# Patient Record
Sex: Male | Born: 1960 | Race: White | Hispanic: No | Marital: Single | Smoking: Former smoker
Health system: Southern US, Community
[De-identification: ages and names within clinical notes are randomized; demographics above are authoritative.]

## PROBLEM LIST (undated history)

## (undated) DIAGNOSIS — K819 Cholecystitis, unspecified: Secondary | ICD-10-CM

## (undated) DIAGNOSIS — I10 Essential (primary) hypertension: Secondary | ICD-10-CM

## (undated) DIAGNOSIS — E785 Hyperlipidemia, unspecified: Secondary | ICD-10-CM

## (undated) DIAGNOSIS — K219 Gastro-esophageal reflux disease without esophagitis: Secondary | ICD-10-CM

## (undated) DIAGNOSIS — M1990 Unspecified osteoarthritis, unspecified site: Secondary | ICD-10-CM

## (undated) HISTORY — PX: COSMETIC SURGERY: SHX468

## (undated) HISTORY — DX: Hyperlipidemia, unspecified: E78.5

## (undated) HISTORY — DX: Cholecystitis, unspecified: K81.9

---

## 2002-06-21 ENCOUNTER — Encounter: Payer: Self-pay | Admitting: Family Medicine

## 2002-06-21 ENCOUNTER — Encounter: Admission: RE | Admit: 2002-06-21 | Discharge: 2002-06-21 | Payer: Self-pay | Admitting: Family Medicine

## 2007-09-24 ENCOUNTER — Encounter: Admission: RE | Admit: 2007-09-24 | Discharge: 2007-09-24 | Payer: Self-pay | Admitting: Family Medicine

## 2007-10-05 ENCOUNTER — Encounter: Admission: RE | Admit: 2007-10-05 | Discharge: 2007-10-05 | Payer: Self-pay | Admitting: Family Medicine

## 2013-12-08 ENCOUNTER — Other Ambulatory Visit: Payer: Self-pay | Admitting: Orthopedic Surgery

## 2013-12-08 DIAGNOSIS — R0781 Pleurodynia: Secondary | ICD-10-CM

## 2013-12-09 ENCOUNTER — Other Ambulatory Visit: Payer: Self-pay

## 2013-12-13 ENCOUNTER — Other Ambulatory Visit: Payer: Self-pay

## 2014-03-22 ENCOUNTER — Ambulatory Visit (INDEPENDENT_AMBULATORY_CARE_PROVIDER_SITE_OTHER): Payer: BC Managed Care – PPO | Admitting: General Surgery

## 2014-04-03 NOTE — Pre-Procedure Instructions (Signed)
Logan Kim  04/03/2014   Your procedure is scheduled on:  Wednesday September 16 th at 1006 AM  Report to [facility] Admitting at [phone] AM.  Call this number if you have problems the morning of surgery: [phone]   Remember:   Do not eat food or drink liquids after midnight.   Take these medicines the morning of surgery with A SIP OF WATER: Hydrocodone if needed for pain Stop Ibuprofen and multivitamins now.  Do not wear jewelry.  Do not wear lotions, powders, or colognes. You may not wear deodorant.   Men may shave face and neck.  Do not bring valuables to the hospital.  [facility] is not responsible for any belongings or valuables.               Contacts, dentures or bridgework may not be worn into surgery.  Leave suitcase in the car. After surgery it may be brought to your room.  For patients admitted to the hospital, discharge time is determined by your treatment team.               Patients discharged the day of surgery will not be allowed to drive home.    Special Instructions: [facility] - Preparing for Surgery  Before surgery, you can play an important role.  Because skin is not sterile, your skin needs to be as free of germs as possible.  You can reduce the number of germs on you skin by washing with CHG (chlorahexidine gluconate) soap before surgery.  CHG is an antiseptic cleaner which kills germs and bonds with the skin to continue killing germs even after washing.  Please DO NOT use if you have an allergy to CHG or antibacterial soaps.  If your skin becomes reddened/irritated stop using the CHG and inform your nurse when you arrive at Short Stay.  Do not shave (including legs and underarms) for at least 48 hours prior to the first CHG shower.  You may shave your face.  Please follow these instructions carefully:   1.  Shower with CHG Soap the night before surgery and the                                morning of Surgery.  2.  If you choose to wash your  hair, wash your hair first as usual with your       normal shampoo.  3.  After you shampoo, rinse your hair and body thoroughly to remove the                      Shampoo.  4.  Use CHG as you would any other liquid soap.  You can apply chg directly       to the skin and wash gently with scrungie or a clean washcloth.  5.  Apply the CHG Soap to your body ONLY FROM THE NECK DOWN.        Do not use on open wounds or open sores.  Avoid contact with your eyes,       ears, mouth and genitals (private parts).  Wash genitals (private parts)       with your normal soap.  6.  Wash thoroughly, paying special attention to the area where your surgery        will be performed.  7.  Thoroughly rinse your body with warm water from the  neck down.  8.  DO NOT shower/wash with your normal soap after using and rinsing off       the CHG Soap.  9.  Pat yourself dry with a clean towel.            10.  Wear clean pajamas.            11.  Place clean sheets on your bed the night of your first shower and do not        sleep with pets.  Day of Surgery  Do not apply any lotions/deoderants the morning of surgery.  Please wear clean clothes to the hospital/surgery center.      Please read over the following fact sheets that you were given: Pain Booklet, Coughing and Deep Breathing and Surgical Site Infection Prevention

## 2014-04-04 ENCOUNTER — Encounter (HOSPITAL_COMMUNITY): Payer: Self-pay

## 2014-04-04 ENCOUNTER — Encounter (HOSPITAL_COMMUNITY)
Admission: RE | Admit: 2014-04-04 | Discharge: 2014-04-04 | Disposition: A | Discharge status: Home / Self Care | Payer: BC Managed Care – PPO | Source: Ambulatory Visit | Attending: Anesthesiology | Admitting: Anesthesiology

## 2014-04-04 ENCOUNTER — Encounter (HOSPITAL_COMMUNITY)
Admission: RE | Admit: 2014-04-04 | Discharge: 2014-04-04 | Disposition: A | Discharge status: Home / Self Care | Payer: BC Managed Care – PPO | Source: Ambulatory Visit | Attending: General Surgery | Admitting: General Surgery

## 2014-04-04 DIAGNOSIS — K8010 Calculus of gallbladder with chronic cholecystitis without obstruction: Secondary | ICD-10-CM | POA: Diagnosis not present

## 2014-04-04 DIAGNOSIS — Z880 Allergy status to penicillin: Secondary | ICD-10-CM | POA: Diagnosis not present

## 2014-04-04 DIAGNOSIS — K8020 Calculus of gallbladder without cholecystitis without obstruction: Secondary | ICD-10-CM | POA: Diagnosis present

## 2014-04-04 HISTORY — DX: Essential (primary) hypertension: I10

## 2014-04-04 HISTORY — DX: Gastro-esophageal reflux disease without esophagitis: K21.9

## 2014-04-04 HISTORY — DX: Unspecified osteoarthritis, unspecified site: M19.90

## 2014-04-04 LAB — CBC
HCT: 49.8 % (ref 39.0–52.0)
Hemoglobin: 17.3 g/dL — ABNORMAL HIGH (ref 13.0–17.0)
MCH: 34.2 pg — AB (ref 26.0–34.0)
MCHC: 34.7 g/dL (ref 30.0–36.0)
MCV: 98.4 fL (ref 78.0–100.0)
Platelets: 235 10*3/uL (ref 150–400)
RBC: 5.06 MIL/uL (ref 4.22–5.81)
RDW: 12.1 % (ref 11.5–15.5)
WBC: 4.4 10*3/uL (ref 4.0–10.5)

## 2014-04-04 MED ORDER — CHLORHEXIDINE GLUCONATE 4 % EX LIQD: 1.0000 "application " | Freq: Once | CUTANEOUS | Status: DC

## 2014-04-04 NOTE — Progress Notes (Signed)
BMP specimen hemolyzed per lab , will need to be redone DOS

## 2014-04-05 ENCOUNTER — Encounter (HOSPITAL_COMMUNITY): Payer: Self-pay | Admitting: *Deleted

## 2014-04-05 ENCOUNTER — Encounter (HOSPITAL_COMMUNITY): Payer: BC Managed Care – PPO | Admitting: Certified Registered Nurse Anesthetist

## 2014-04-05 ENCOUNTER — Encounter (HOSPITAL_COMMUNITY)
Admission: RE | Disposition: A | Discharge status: Home / Self Care | Payer: Self-pay | Source: Ambulatory Visit | Attending: General Surgery

## 2014-04-05 ENCOUNTER — Ambulatory Visit (HOSPITAL_COMMUNITY): Payer: BC Managed Care – PPO | Admitting: Certified Registered Nurse Anesthetist

## 2014-04-05 ENCOUNTER — Ambulatory Visit (HOSPITAL_COMMUNITY)
Admission: RE | Admit: 2014-04-05 | Discharge: 2014-04-05 | Disposition: A | Discharge status: Home / Self Care | Payer: BC Managed Care – PPO | Source: Ambulatory Visit | Attending: General Surgery | Admitting: General Surgery

## 2014-04-05 DIAGNOSIS — K8010 Calculus of gallbladder with chronic cholecystitis without obstruction: Secondary | ICD-10-CM | POA: Diagnosis not present

## 2014-04-05 DIAGNOSIS — Z880 Allergy status to penicillin: Secondary | ICD-10-CM | POA: Insufficient documentation

## 2014-04-05 DIAGNOSIS — K8020 Calculus of gallbladder without cholecystitis without obstruction: Secondary | ICD-10-CM

## 2014-04-05 HISTORY — PX: CHOLECYSTECTOMY: SHX55

## 2014-04-05 LAB — BASIC METABOLIC PANEL
ANION GAP: 11 (ref 5–15)
BUN: 12 mg/dL (ref 6–23)
CALCIUM: 9.1 mg/dL (ref 8.4–10.5)
CO2: 28 meq/L (ref 19–32)
CREATININE: 0.85 mg/dL (ref 0.50–1.35)
Chloride: 98 mEq/L (ref 96–112)
GFR calc Af Amer: >90 mL/min (ref >90)
Glucose, Bld: 111 mg/dL — ABNORMAL HIGH (ref 70–99)
Potassium: 4 mEq/L (ref 3.7–5.3)
SODIUM: 137 meq/L (ref 137–147)

## 2014-04-05 SURGERY — LAPAROSCOPIC CHOLECYSTECTOMY WITH INTRAOPERATIVE CHOLANGIOGRAM
Anesthesia: General | Site: Abdomen

## 2014-04-05 MED ORDER — MIDAZOLAM HCL 2 MG/2ML IJ SOLN
INTRAMUSCULAR | Status: AC
  Filled 2014-04-05: qty 2

## 2014-04-05 MED ORDER — HYDROMORPHONE HCL 1 MG/ML IJ SOLN
0.5000 mg | INTRAMUSCULAR | Status: DC | PRN
  Administered 2014-04-05: 0.5 mg via INTRAVENOUS

## 2014-04-05 MED ORDER — PROPOFOL 10 MG/ML IV BOLUS
INTRAVENOUS | Status: AC
  Filled 2014-04-05: qty 20

## 2014-04-05 MED ORDER — FENTANYL CITRATE 0.05 MG/ML IJ SOLN
INTRAMUSCULAR | Status: AC
  Filled 2014-04-05: qty 5

## 2014-04-05 MED ORDER — VANCOMYCIN HCL 10 G IV SOLR
1500.0000 mg | INTRAVENOUS | Status: AC
  Administered 2014-04-05: 1500 mg via INTRAVENOUS
  Filled 2014-04-05 (×2): qty 1500

## 2014-04-05 MED ORDER — BUPIVACAINE-EPINEPHRINE (PF) 0.25% -1:200000 IJ SOLN
INTRAMUSCULAR | Status: AC
Start: 2014-04-05 — End: 2014-04-05
  Filled 2014-04-05: qty 30

## 2014-04-05 MED ORDER — ONDANSETRON HCL 4 MG/2ML IJ SOLN: 4.0000 mg | Freq: Once | INTRAMUSCULAR | Status: DC | PRN

## 2014-04-05 MED ORDER — NEOSTIGMINE METHYLSULFATE 10 MG/10ML IV SOLN
INTRAVENOUS | Status: DC | PRN
  Administered 2014-04-05: 5 mg via INTRAVENOUS

## 2014-04-05 MED ORDER — OXYCODONE HCL 5 MG PO TABS
5.0000 mg | ORAL_TABLET | Freq: Once | ORAL | Status: AC
  Administered 2014-04-05: 5 mg via ORAL

## 2014-04-05 MED ORDER — FENTANYL CITRATE 0.05 MG/ML IJ SOLN
INTRAMUSCULAR | Status: DC | PRN
  Administered 2014-04-05 (×2): 100 ug via INTRAVENOUS
  Administered 2014-04-05 (×2): 50 ug via INTRAVENOUS
  Administered 2014-04-05 (×2): 100 ug via INTRAVENOUS

## 2014-04-05 MED ORDER — SODIUM CHLORIDE 0.9 % IR SOLN
Status: DC | PRN
  Administered 2014-04-05: 1000 mL

## 2014-04-05 MED ORDER — HYDROMORPHONE HCL 1 MG/ML IJ SOLN
INTRAMUSCULAR | Status: AC
  Administered 2014-04-05: 0.5 mg via INTRAVENOUS
  Filled 2014-04-05: qty 1

## 2014-04-05 MED ORDER — ROCURONIUM BROMIDE 100 MG/10ML IV SOLN
INTRAVENOUS | Status: DC | PRN
  Administered 2014-04-05: 50 mg via INTRAVENOUS

## 2014-04-05 MED ORDER — CEFAZOLIN SODIUM-DEXTROSE 2-3 GM-% IV SOLR
INTRAVENOUS | Status: AC
  Filled 2014-04-05: qty 50

## 2014-04-05 MED ORDER — HYDROMORPHONE HCL PF 1 MG/ML IJ SOLN
INTRAMUSCULAR | Status: DC
Start: 2014-04-05 — End: 2014-04-05
  Filled 2014-04-05: qty 1

## 2014-04-05 MED ORDER — BUPIVACAINE-EPINEPHRINE 0.25% -1:200000 IJ SOLN
INTRAMUSCULAR | Status: DC | PRN
  Administered 2014-04-05: 30 mL

## 2014-04-05 MED ORDER — PROPOFOL 10 MG/ML IV BOLUS
INTRAVENOUS | Status: DC | PRN
  Administered 2014-04-05: 200 mg via INTRAVENOUS

## 2014-04-05 MED ORDER — CEFAZOLIN SODIUM-DEXTROSE 2-3 GM-% IV SOLR: 2.0000 g | INTRAVENOUS | Status: DC

## 2014-04-05 MED ORDER — 0.9 % SODIUM CHLORIDE (POUR BTL) OPTIME
TOPICAL | Status: DC | PRN
  Administered 2014-04-05: 1000 mL

## 2014-04-05 MED ORDER — HYDROMORPHONE HCL 1 MG/ML IJ SOLN
INTRAMUSCULAR | Status: AC
  Filled 2014-04-05: qty 1

## 2014-04-05 MED ORDER — SODIUM CHLORIDE 0.9 % IV SOLN: INTRAVENOUS | Status: DC | PRN

## 2014-04-05 MED ORDER — GLYCOPYRROLATE 0.2 MG/ML IJ SOLN
INTRAMUSCULAR | Status: DC | PRN
  Administered 2014-04-05: 0.6 mg via INTRAVENOUS

## 2014-04-05 MED ORDER — LIDOCAINE HCL (CARDIAC) 20 MG/ML IV SOLN
INTRAVENOUS | Status: DC | PRN
  Administered 2014-04-05: 100 mg via INTRAVENOUS

## 2014-04-05 MED ORDER — ONDANSETRON HCL 4 MG/2ML IJ SOLN
INTRAMUSCULAR | Status: DC | PRN
  Administered 2014-04-05: 4 mg via INTRAVENOUS

## 2014-04-05 MED ORDER — DEXAMETHASONE SODIUM PHOSPHATE 4 MG/ML IJ SOLN
INTRAMUSCULAR | Status: DC | PRN
  Administered 2014-04-05: 4 mg via INTRAVENOUS

## 2014-04-05 MED ORDER — LACTATED RINGERS IV SOLN
INTRAVENOUS | Status: DC
  Administered 2014-04-05 (×2): via INTRAVENOUS

## 2014-04-05 MED ORDER — HYDROMORPHONE HCL PF 1 MG/ML IJ SOLN
0.2500 mg | INTRAMUSCULAR | Status: DC | PRN
  Administered 2014-04-05 (×4): 0.5 mg via INTRAVENOUS

## 2014-04-05 MED ORDER — OXYCODONE HCL 5 MG PO TABS
ORAL_TABLET | ORAL | Status: AC
  Filled 2014-04-05: qty 1

## 2014-04-05 MED ORDER — OXYCODONE HCL 5 MG PO TABS: 5.0000 mg | ORAL_TABLET | Freq: Four times a day (QID) | ORAL | Status: DC | PRN

## 2014-04-05 MED ORDER — EPHEDRINE SULFATE 50 MG/ML IJ SOLN
INTRAMUSCULAR | Status: DC | PRN
  Administered 2014-04-05: 10 mg via INTRAVENOUS

## 2014-04-05 SURGICAL SUPPLY — 42 items
APPLIER CLIP 5 13 M/L LIGAMAX5 (MISCELLANEOUS) ×2
BLADE SURG ROTATE 9660 (MISCELLANEOUS) ×2 IMPLANT
CANISTER SUCTION 2500CC (MISCELLANEOUS) ×2 IMPLANT
CHLORAPREP W/TINT 26ML (MISCELLANEOUS) ×2 IMPLANT
CLIP APPLIE 5 13 M/L LIGAMAX5 (MISCELLANEOUS) ×1 IMPLANT
COVER MAYO STAND STRL (DRAPES) ×2 IMPLANT
COVER SURGICAL LIGHT HANDLE (MISCELLANEOUS) ×2 IMPLANT
DERMABOND ADVANCED (GAUZE/BANDAGES/DRESSINGS) ×1
DERMABOND ADVANCED .7 DNX12 (GAUZE/BANDAGES/DRESSINGS) ×1 IMPLANT
DRAPE C-ARM 42X72 X-RAY (DRAPES) ×2 IMPLANT
DRAPE UTILITY 15X26 W/TAPE STR (DRAPE) ×4 IMPLANT
ELECT REM PT RETURN 9FT ADLT (ELECTROSURGICAL) ×2
ELECTRODE REM PT RTRN 9FT ADLT (ELECTROSURGICAL) ×1 IMPLANT
FILTER SMOKE EVAC LAPAROSHD (FILTER) IMPLANT
GLOVE BIO SURGEON STRL SZ8 (GLOVE) ×2 IMPLANT
GLOVE BIOGEL PI IND STRL 7.0 (GLOVE) ×2 IMPLANT
GLOVE BIOGEL PI IND STRL 8 (GLOVE) ×1 IMPLANT
GLOVE BIOGEL PI INDICATOR 7.0 (GLOVE) ×2
GLOVE BIOGEL PI INDICATOR 8 (GLOVE) ×1
GLOVE SURG SS PI 7.0 STRL IVOR (GLOVE) ×2 IMPLANT
GOWN STRL REUS W/ TWL LRG LVL3 (GOWN DISPOSABLE) ×3 IMPLANT
GOWN STRL REUS W/ TWL XL LVL3 (GOWN DISPOSABLE) ×1 IMPLANT
GOWN STRL REUS W/TWL LRG LVL3 (GOWN DISPOSABLE) ×3
GOWN STRL REUS W/TWL XL LVL3 (GOWN DISPOSABLE) ×1
KIT BASIN OR (CUSTOM PROCEDURE TRAY) ×2 IMPLANT
KIT ROOM TURNOVER OR (KITS) ×2 IMPLANT
NEEDLE 22X1 1/2 (OR ONLY) (NEEDLE) ×2 IMPLANT
NS IRRIG 1000ML POUR BTL (IV SOLUTION) ×2 IMPLANT
PAD ARMBOARD 7.5X6 YLW CONV (MISCELLANEOUS) ×2 IMPLANT
POUCH RETRIEVAL ECOSAC 10 (ENDOMECHANICALS) ×1 IMPLANT
POUCH RETRIEVAL ECOSAC 10MM (ENDOMECHANICALS) ×1
SCISSORS LAP 5X35 DISP (ENDOMECHANICALS) ×2 IMPLANT
SET CHOLANGIOGRAPH 5 50 .035 (SET/KITS/TRAYS/PACK) ×2 IMPLANT
SET IRRIG TUBING LAPAROSCOPIC (IRRIGATION / IRRIGATOR) ×2 IMPLANT
SLEEVE ENDOPATH XCEL 5M (ENDOMECHANICALS) ×4 IMPLANT
SPECIMEN JAR SMALL (MISCELLANEOUS) ×2 IMPLANT
SUT VIC AB 4-0 PS2 27 (SUTURE) ×2 IMPLANT
TOWEL OR 17X24 6PK STRL BLUE (TOWEL DISPOSABLE) ×2 IMPLANT
TOWEL OR 17X26 10 PK STRL BLUE (TOWEL DISPOSABLE) ×2 IMPLANT
TRAY LAPAROSCOPIC (CUSTOM PROCEDURE TRAY) ×2 IMPLANT
TROCAR XCEL BLUNT TIP 100MML (ENDOMECHANICALS) ×2 IMPLANT
TROCAR XCEL NON-BLD 5MMX100MML (ENDOMECHANICALS) ×2 IMPLANT

## 2014-04-05 NOTE — Op Note (Signed)
04/05/2014  11:15 AM  PATIENT:  Logan Kim  53 y.o. male  PRE-OPERATIVE DIAGNOSIS:  gallstones  POST-OPERATIVE DIAGNOSIS:  gallstones  PROCEDURE:  Procedure(s): LAPAROSCOPIC CHOLECYSTECTOMY   SURGEON:  Surgeon(s): Georganna Skeans, MD  ASSISTANTS: none   ANESTHESIA:   local and general  EBL:     BLOOD ADMINISTERED:none  DRAINS: none   SPECIMEN:  Excision  DISPOSITION OF SPECIMEN:  PATHOLOGY  COUNTS:  YES  DICTATION: .Dragon Dictation  Findings: Macronodular liver concerning for cirrhosis, chronic inflammation of the gallbladder   Patient presents for cholecystectomy. He was identified in the preop holding area. Informed consent was obtained. He received intravenous antibiotics. He was brought to the operating room and general endotracheal anesthesia was administered by the anesthesia staff. Abdomen was prepped and draped in a sterile fashion. We did a time out procedure. Infraumbilical region was infiltrated with local. Infraumbilical incision was made. Subcutaneous tissues were dissected down revealing the anterior fascia. This was divided sharply along the midline. The abdomen was entered under direct vision. 0 Vicryl pursestring was placed on the fascial opening. Hassan trocar was inserted into the abdomen. Abdomen was insufflated with carbon dioxide in standard fashion. Under direct vision, a 5 mm epigastric and 5 mm lateral ports x2 were placed. Local was used at each port site. Laparoscopic exploration revealed a macronodular liver and some filmy adhesions to the gallbladder. These were swept down revealing the infundibulum. The dome was retracted superior medially. The infundibulum was retracted inferolaterally. Dissection began laterally and progressed medially. This identified the cystic artery anterior to the cystic duct. Cystic artery was dissected out. It was then clipped twice proximally and once distally and divided. Cystic duct was then further dissected until a  large window was created between the cystic duct, the infundibulum, and the liver. Once we had excellent visualization a clip was placed on the infundibular cystic duct junction. Small nick was made the cystic duct. We attempted to do a cholangiogram, but We could not get a good seal of the catheter in the cystic duct. It kept leaking. Cholangiogram was abandoned. 3 clips were placed proximal and the cystic duct and it was divided. Gallbladder was taken off the liver bed with Bovie cautery. Excellent hemostasis was obtained along the way. Gallbladder was placed in an ecosac and removed from the abdomen via the infraumbilical port site.Liver bed was cauterized excellent hemostasis. Clips were inspected were in good position. Liver bed was completely dry. Irrigation fluid was evacuated. Ports were removed under direct vision. Pneumoperitoneum was released. Infraumbilical fascia was closed by tying the pursestring. All 4 wounds were copiously irrigated and the skin of each was closed with 4-0 Vicryl subcuticular followed by Dermabond. All counts were correct. Patient tolerated procedure well without apparent complication was taken recovery in stable condition.  PATIENT DISPOSITION:  PACU - hemodynamically stable.   Delay start of Pharmacological VTE agent (>24hrs) due to surgical blood loss or risk of bleeding:  no  Georganna Skeans, MD, MPH, FACS Pager: [phone]  9/16/201511:15 AM

## 2014-04-05 NOTE — H&P (Signed)
  History of Present Illness Lavone Neri E. Grandville Silos MD; 03/22/2014 11:07 AM) The patient is a 53 year old male who presents for evaluation of gallbladder disease. patient with right upper quadrant pain intermittently since May.It usually happens at night. It is located in his right upper quadrant and extends through to his back. He has not noticed changes with eating. It tends to resolve spontaneously. He has been taking hydrocodone with Tylenol for relief.   Allergies [facility] [place], LPN; 10/19/6604 30:16 AM) Penicillin G Potassium *PENICILLINS* Rash.  Medication History Joseph Pierini, LPN; 0/07/930 35:57 AM) Losartan Potassium-HCTZ (100-12.5MG  Tablet, 1 Oral daily) Active. Hydrocodone-Acetaminophen (5-325MG  Tablet, 1 Oral prn) Active.  Vitals Jenny Reichmann Smithey LPN; 09/20/2023 42:70 AM) 03/22/2014 10:58 AM Weight: 238 lb Height: 71in Body Surface Area: 2.33 m Body Mass Index: 33.19 kg/m Temp.: 98.27F(Oral)  Pulse: 80 (Regular)  Resp.: 18 (Unlabored)  BP: 160/102 (Sitting, Left Arm, Standard)    Physical Exam Lavone Neri E. Grandville Silos MD; 03/22/2014 11:08 AM) General Note: awake and alert, no distress   Head and Neck Note: pupils equal and reactive, sclera clear   Chest and Lung Exam Note: clear to auscultation bilaterally, no respiratory distress   Cardiovascular Note: regular rate and rhythm, distal pulses palpable   Abdomen Note: soft, nontender, nondistended, no masses, bowel sounds active   Neuropsychiatric Note: normal mood and affect   Musculoskeletal Note: moves extremities well, equal strength     Assessment & Plan Lavone Neri E. Grandville Silos MD; 03/22/2014 11:10 AM) CHOLELITHIASIS (574.20  K80.20) Current Plans  CHOLECYSTECTOMY, LAPAROSCOPIC, WITH CHOLANGIOGRAPHY [phone]) (Procedure, risks, and benefits were discussed in detail with the patient. I will also refill his hydrocodone. I look forward to scheduling this in the near future.)   Signed by Zenovia Jarred, MD (03/22/2014 11:12 AM)

## 2014-04-05 NOTE — Discharge Instructions (Signed)
What to eat: ° °For your first meals, you should eat lightly; only small meals initially.  If you do not have nausea, you may eat larger meals.  Avoid spicy, greasy and heavy food.   ° °General Anesthesia, Adult, Care After  °Refer to this sheet in the next few weeks. These instructions provide you with information on caring for yourself after your procedure. Your health care provider may also give you more specific instructions. Your treatment has been planned according to current medical practices, but problems sometimes occur. Call your health care provider if you have any problems or questions after your procedure.  °WHAT TO EXPECT AFTER THE PROCEDURE  °After the procedure, it is typical to experience:  °Sleepiness.  °Nausea and vomiting. °HOME CARE INSTRUCTIONS  °For the first 24 hours after general anesthesia:  °Have a responsible person with you.  °Do not drive a car. If you are alone, do not take public transportation.  °Do not drink alcohol.  °Do not take medicine that has not been prescribed by your health care provider.  °Do not sign important papers or make important decisions.  °You may resume a normal diet and activities as directed by your health care provider.  °Change bandages (dressings) as directed.  °If you have questions or problems that seem related to general anesthesia, call the hospital and ask for the anesthetist or anesthesiologist on call. °SEEK MEDICAL CARE IF:  °You have nausea and vomiting that continue the day after anesthesia.  °You develop a rash. °SEEK IMMEDIATE MEDICAL CARE IF:  °You have difficulty breathing.  °You have chest pain.  °You have any allergic problems. °Document Released: 10/13/2000 Document Revised: 03/09/2013 Document Reviewed: 01/20/2013  °ExitCare® Patient Information ©2014 [employer].  ° °Tissue Adhesive Wound Care  ° ° °Some cuts and wounds can be closed with tissue adhesive. Adhesive is like glue. It holds the skin together and helps a wound heal faster.  This adhesive goes away on its own as the wound heals.  °HOME CARE  °Showers are allowed. Do not soak the wound in water. Do not take baths, swim, or use hot tubs. Do not use soaps or creams on your wound.  °If a bandage (dressing) was put on, change it as often as told by your doctor.  °Keep the bandage dry.  °Do not scratch, pick, or rub the adhesive.  °Do not put tape over the adhesive. The adhesive could come off.  °Protect the wound from another injury.  °Protect the wound from sun and tanning beds.  °Only take medicine as told by your doctor.  °Keep all doctor visits as told. °GET HELP RIGHT AWAY IF:  °Your wound is red, puffy (swollen), hot, or tender.  °You get a rash after the glue is put on.  °You have more pain in the wound.  °You have a red streak going away from the wound.  °You have yellowish-white fluid (pus) coming from the wound.  °You have more bleeding.  °You have a fever.  °You have chills and start to shake.  °You notice a bad smell coming from the wound.  °Your wound or adhesive breaks open. °MAKE SURE YOU:  °Understand these instructions.  °Will watch your condition.  °Will get help right away if you are not doing well or get worse. °Document Released: 04/15/2008 Document Revised: 04/27/2013 Document Reviewed: 01/26/2013  °ExitCare® Patient Information ©2015 [employer]. This information is not intended to replace advice given to you by your health care provider.   Make sure you discuss any questions you have with your health care provider.  ° ° °

## 2014-04-05 NOTE — Interval H&P Note (Signed)
History and Physical Interval Note:  04/05/2014 9:20 AM  Logan Kim  has presented today for surgery, with the diagnosis of gallstones  The various methods of treatment have been discussed with the patient and family. After consideration of risks, benefits and other options for treatment, the patient has consented to  Procedure(s): LAPAROSCOPIC CHOLECYSTECTOMY WITH INTRAOPERATIVE CHOLANGIOGRAM (N/A) as a surgical intervention .  The patient's history has been reviewed, patient examined, no change in status, stable for surgery.  I have reviewed the patient's chart and labs.  Questions were answered to the patient's satisfaction.     Gilda Abboud E

## 2014-04-05 NOTE — Anesthesia Postprocedure Evaluation (Signed)
  Anesthesia Post-op Note  Patient: Logan Kim  Procedure(s) Performed: Procedure(s): LAPAROSCOPIC CHOLECYSTECTOMY  (N/A)  Patient Location: PACU  Anesthesia Type:General  Level of Consciousness: awake, alert , oriented and patient cooperative  Airway and Oxygen Therapy: Patient Spontanous Breathing  Post-op Pain: mild  Post-op Assessment: Post-op Vital signs reviewed, Patient's Cardiovascular Status Stable, Respiratory Function Stable, Patent Airway and No signs of Nausea or vomiting  Post-op Vital Signs: stable  Last Vitals:  Filed Vitals:   04/05/14 1200  BP:   Pulse: 63  Temp:   Resp: 12    Complications: No apparent anesthesia complications

## 2014-04-05 NOTE — Transfer of Care (Signed)
Immediate Anesthesia Transfer of Care Note  Patient: Logan Kim  Procedure(s) Performed: Procedure(s): LAPAROSCOPIC CHOLECYSTECTOMY  (N/A)  Patient Location: PACU  Anesthesia Type:General  Level of Consciousness: awake, alert , oriented, patient cooperative and responds to stimulation  Airway & Oxygen Therapy: Patient Spontanous Breathing and Patient connected to nasal cannula oxygen  Post-op Assessment: Report given to PACU RN, Post -op Vital signs reviewed and stable and Patient moving all extremities X 4  Post vital signs: Reviewed and stable  Complications: No apparent anesthesia complications

## 2014-04-05 NOTE — Anesthesia Preprocedure Evaluation (Signed)
Anesthesia Evaluation  Patient identified by MRN, date of birth, ID band Patient awake    Reviewed: Allergy & Precautions, H&P , NPO status , Patient's Chart, lab work & pertinent test results  Airway       Dental   Pulmonary former smoker,          Cardiovascular hypertension,     Neuro/Psych    GI/Hepatic GERD-  ,  Endo/Other    Renal/GU      Musculoskeletal  (+) Arthritis -,   Abdominal   Peds  Hematology   Anesthesia Other Findings   Reproductive/Obstetrics                           Anesthesia Physical Anesthesia Plan  ASA: II  Anesthesia Plan: General   Post-op Pain Management:    Induction: Intravenous  Airway Management Planned: Oral ETT  Additional Equipment:   Intra-op Plan:   Post-operative Plan: Extubation in OR  Informed Consent: I have reviewed the patients History and Physical, chart, labs and discussed the procedure including the risks, benefits and alternatives for the proposed anesthesia with the patient or authorized representative who has indicated his/her understanding and acceptance.     Plan Discussed with: CRNA, Anesthesiologist and Surgeon  Anesthesia Plan Comments:         Anesthesia Quick Evaluation

## 2014-04-06 ENCOUNTER — Encounter (HOSPITAL_COMMUNITY): Payer: Self-pay | Admitting: General Surgery

## 2014-05-24 ENCOUNTER — Telehealth: Payer: Self-pay | Admitting: Internal Medicine

## 2014-05-30 NOTE — Telephone Encounter (Signed)
Called pt on 05/29/14 to set up OV per Dr. Henrene Pastor after reviewing notes.  Told him it would be in January and he said never mind he could not wait until then and wanted to have the procedure done before the end of the year.  Michela Pitcher he would come by to pick up his records.

## 2015-04-26 ENCOUNTER — Encounter: Payer: Self-pay | Admitting: Internal Medicine

## 2015-04-26 ENCOUNTER — Ambulatory Visit (INDEPENDENT_AMBULATORY_CARE_PROVIDER_SITE_OTHER): Payer: 59 | Admitting: Internal Medicine

## 2015-04-26 VITALS — BP 130/70 | HR 80 | Ht 71.0 in | Wt 221.0 lb

## 2015-04-26 DIAGNOSIS — R7989 Other specified abnormal findings of blood chemistry: Secondary | ICD-10-CM

## 2015-04-26 DIAGNOSIS — K591 Functional diarrhea: Secondary | ICD-10-CM | POA: Diagnosis not present

## 2015-04-26 DIAGNOSIS — F1010 Alcohol abuse, uncomplicated: Secondary | ICD-10-CM

## 2015-04-26 DIAGNOSIS — R1032 Left lower quadrant pain: Secondary | ICD-10-CM

## 2015-04-26 DIAGNOSIS — Z86010 Personal history of colonic polyps: Secondary | ICD-10-CM | POA: Diagnosis not present

## 2015-04-26 DIAGNOSIS — K760 Fatty (change of) liver, not elsewhere classified: Secondary | ICD-10-CM

## 2015-04-26 DIAGNOSIS — R945 Abnormal results of liver function studies: Secondary | ICD-10-CM

## 2015-04-26 MED ORDER — NA SULFATE-K SULFATE-MG SULF 17.5-3.13-1.6 GM/177ML PO SOLN: 1.0000 | Freq: Once | ORAL | Status: AC

## 2015-04-26 NOTE — Progress Notes (Signed)
HISTORY OF PRESENT ILLNESS:  Logan Kim is a 54 y.o. male with hypertension, hyperlipidemia, obesity, and chronic alcohol abuse who self-referred regarding GI complaints and the need for surveillance colonoscopy. His mother is Logan Kim. Patient brings multiple outside records. Previously seen by Dr. Michail Kim at [place]. Upper endoscopy was performed 03/09/2008 for the indications of anemia and melena. There was erythema of the duodenal bulb. The exam was otherwise normal. Biopsies revealed mild chronic duodenitis. He also underwent complete colonoscopy on 03/17/2008. He was found to have 3 tubular adenomas. Follow-up in 3 years recommended. Patient has not had follow-up. Complaints they include 7 year history of intermittent left lower quadrant pain. Not exacerbated by meals or defecation. Transient without nausea or vomiting. Next, intermittent loose stools with greasy food since having his gallbladder out last year. No bleeding. Next, 10 pound weight loss of uncertain cause. Review of outside records shows laboratories from 04/04/2015. Comprehensive metabolic panel is remarkable for elevated glucose at 160, abnormal liver tests with SGOT 58, SGPT 59, alk phosphatase 70, total bilirubin 1.3. Albumin normal at 4.2 as his protein at 7.2. CBC reveals no abnormalities with platelet count 285,000. Abdominal ultrasound from 2009 reveals fatty liver. The patient drinks at least 4 alcoholic beverages per day. No family history of liver disease. Colon cancer in a maternal aunt only.  REVIEW OF SYSTEMS:  All non-GI ROS negative except for arthritis  Past Medical History  Diagnosis Date  . Hypertension   . GERD (gastroesophageal reflux disease)     otc meds,Tums  . Arthritis   . Hyperlipidemia   . Cholecystitis     Past Surgical History  Procedure Laterality Date  . Cosmetic surgery Right     great right toe injury with lawn mower  . Cholecystectomy N/A 04/05/2014    Procedure: LAPAROSCOPIC  CHOLECYSTECTOMY ;  Surgeon: Logan Skeans, MD;  Location: [place];  Service: General;  Laterality: N/A;    Social History Logan Kim  reports that he quit smoking about 9 years ago. His smoking use included Cigarettes. He has a 30 pack-year smoking history. He has never used smokeless tobacco. He reports that he drinks alcohol. He reports that he does not use illicit drugs.  family history includes Breast cancer in his mother; Colon cancer in his maternal aunt; Heart disease in his father and paternal grandfather.  Allergies  Allergen Reactions  . Penicillins Rash       PHYSICAL EXAMINATION: Vital signs: BP 130/70 mmHg  Pulse 80  Ht _0  (1.803 m)  Wt 221 lb (100.245 kg)  BMI 30.84 kg/m2  Constitutional: Overweight but generally well-appearing, no acute distress Psychiatric: alert and oriented x3, cooperative Eyes: extraocular movements intact, anicteric, conjunctiva bloodshot Mouth: oral pharynx moist, no lesions Neck: supple without thyromegaly Lymph: no lymphadenopathy Cardiovascular: heart regular rate and rhythm, no murmur Lungs: clear to auscultation bilaterally Abdomen: soft, obese, nontender, nondistended, no obvious ascites, no peritoneal signs, normal bowel sounds, no organomegaly Rectal: Deferred until colonoscopy Extremities: no clubbing cyanosis or lower extremity edema bilaterally Skin: Hypervascularity of the malar skin; no lesions on visible extremities Neuro: No focal deficits. No asterixis.   ASSESSMENT:  #1. Personal history of 3 tubular adenomas 2009. Overdue for follow-up colonoscopy #2. Chronic intermittent left lower quadrant pain without worrisome features. Suspect colonic spasm #3. Reports of weight loss. Uncertain etiology #4. Intermittent bouts of diarrhea as described. Suspect post cholecystectomy #5. Elevated liver tests. All most certainly due to alcohol. Does have fatty liver. At  risk for cirrhosis long-term  PLAN:  #1. Surveillance  colonoscopy.The nature of the procedure, as well as the risks, benefits, and alternatives were carefully and thoroughly reviewed with the patient. Ample time for discussion and questions allowed. The patient understood, was satisfied, and agreed to proceed. #2. Reassurance #3. Fiber #4. Stop alcohol abuse

## 2015-04-26 NOTE — Patient Instructions (Signed)

## 2015-06-29 ENCOUNTER — Ambulatory Visit (AMBULATORY_SURGERY_CENTER): Payer: 59 | Admitting: Internal Medicine

## 2015-06-29 ENCOUNTER — Encounter: Payer: Self-pay | Admitting: Internal Medicine

## 2015-06-29 VITALS — BP 122/75 | HR 86 | Temp 97.2°F | Resp 23 | Ht 71.0 in | Wt 221.0 lb

## 2015-06-29 DIAGNOSIS — R1032 Left lower quadrant pain: Secondary | ICD-10-CM

## 2015-06-29 DIAGNOSIS — D122 Benign neoplasm of ascending colon: Secondary | ICD-10-CM

## 2015-06-29 DIAGNOSIS — Z860100 Personal history of colon polyps, unspecified: Secondary | ICD-10-CM

## 2015-06-29 MED ORDER — SODIUM CHLORIDE 0.9 % IV SOLN: 500.0000 mL | INTRAVENOUS | Status: DC

## 2015-06-29 NOTE — Patient Instructions (Signed)
YOU HAD AN ENDOSCOPIC PROCEDURE TODAY AT THE [facility]:   Refer to the procedure report that was given to you for any specific questions about what was found during the examination.  If the procedure report does not answer your questions, please call your gastroenterologist to clarify.  If you requested that your care partner not be given the details of your procedure findings, then the procedure report has been included in a sealed envelope for you to review at your convenience later.  YOU SHOULD EXPECT: Some feelings of bloating in the abdomen. Passage of more gas than usual.  Walking can help get rid of the air that was put into your GI tract during the procedure and reduce the bloating. If you had a lower endoscopy (such as a colonoscopy or flexible sigmoidoscopy) you may notice spotting of blood in your stool or on the toilet paper. If you underwent a bowel prep for your procedure, you may not have a normal bowel movement for a few days.  Please Note:  You might notice some irritation and congestion in your nose or some drainage.  This is from the oxygen used during your procedure.  There is no need for concern and it should clear up in a day or so.  SYMPTOMS TO REPORT IMMEDIATELY:   Following lower endoscopy (colonoscopy or flexible sigmoidoscopy):  Excessive amounts of blood in the stool  Significant tenderness or worsening of abdominal pains  Swelling of the abdomen that is new, acute  Fever of 100F or higher    For urgent or emergent issues, a gastroenterologist can be reached at any hour by calling [phone].   DIET: Your first meal following the procedure should be a small meal and then it is ok to progress to your normal diet. Heavy or fried foods are harder to digest and may make you feel nauseous or bloated.  Likewise, meals heavy in dairy and vegetables can increase bloating.  Drink plenty of fluids but you should avoid alcoholic beverages for 24  hours.  ACTIVITY:  You should plan to take it easy for the rest of today and you should NOT DRIVE or use heavy machinery until tomorrow (because of the sedation medicines used during the test).    FOLLOW UP: Our staff will call the number listed on your records the next business day following your procedure to check on you and address any questions or concerns that you may have regarding the information given to you following your procedure. If we do not reach you, we will leave a message.  However, if you are feeling well and you are not experiencing any problems, there is no need to return our call.  We will assume that you have returned to your regular daily activities without incident.  If any biopsies were taken you will be contacted by phone or by letter within the next 1-3 weeks.  Please call us at [phone] if you have not heard about the biopsies in 3 weeks.    SIGNATURES/CONFIDENTIALITY: You and/or your care partner have signed paperwork which will be entered into your electronic medical record.  These signatures attest to the fact that that the information above on your After Visit Summary has been reviewed and is understood.  Full responsibility of the confidentiality of this discharge information lies with you and/or your care-partner.   Information on polyps given to you today 

## 2015-06-29 NOTE — Progress Notes (Signed)
Called to room to assist during endoscopic procedure.  Patient ID and intended procedure confirmed with present staff. Received instructions for my participation in the procedure from the performing physician.  

## 2015-06-29 NOTE — Op Note (Signed)
[address] [employer] [address] [place], [id number]   COLONOSCOPY PROCEDURE REPORT  PATIENT: Logan, Kim  MR#: [id number] BIRTHDATE: 12/25/1960 , 33  yrs. old GENDER: male ENDOSCOPIST: Eustace Quail, MD REFERRED BY:.  Self / Office PROCEDURE DATE:  06/29/2015 PROCEDURE:   Colonoscopy, surveillance and Colonoscopy with snare polypectomy x 1 First Screening Colonoscopy - Avg.  risk and is 50 yrs.  old or older - No.  Prior Negative Screening - Now for repeat screening. N/A  History of Adenoma - Now for follow-up colonoscopy & has been > or = to 3 yrs.  Yes hx of adenoma.  Has been 3 or more years since last colonoscopy.  Polyps removed today? Yes ASA CLASS:   Class II INDICATIONS:Surveillance due to prior colonic neoplasia and PH Colon Adenoma. Index examination August 2009 with Dr. Michail Sermon. Small adenomas. MEDICATIONS: Monitored anesthesia care and Propofol 500 mg IV  ; lidocaine 200 mg IV  DESCRIPTION OF PROCEDURE:   After the risks benefits and alternatives of the procedure were thoroughly explained, informed consent was obtained.  The digital rectal exam revealed no abnormalities of the rectum.   The LB [id number]  endoscope was introduced through the anus and advanced to the cecum, which was identified by both the appendix and ileocecal valve. No adverse events experienced.   The quality of the prep was excellent. (Suprep was used)  The instrument was then slowly withdrawn as the colon was fully examined. Estimated blood loss is zero unless otherwise noted in this procedure report.  COLON FINDINGS: A sessile polyp measuring 8 mm in size was found in the ascending colon.  A polypectomy was performed with a cold snare.  The resection was complete, the polyp tissue was completely retrieved and sent to histology.   The examination was otherwise normal.  Retroflexed views revealed no abnormalities. The time to cecum = 3.3 Withdrawal time = 12.0   The scope  was withdrawn and the procedure completed. COMPLICATIONS: There were no immediate complications.  ENDOSCOPIC IMPRESSION: 1.   Sessile polyp was found in the ascending colon; polypectomy was performed with a cold snare 2.   The examination was otherwise normal  RECOMMENDATIONS: 1. Follow up colonoscopy in 5 years  eSigned:  Eustace Quail, MD 06/29/2015 1:42 PM   cc: The Patient and Kristie Cowman, MD

## 2015-06-29 NOTE — Progress Notes (Signed)
Report to PACU, RN, vss, BBS= Clear.  

## 2015-07-02 ENCOUNTER — Telehealth: Payer: Self-pay

## 2015-07-02 NOTE — Telephone Encounter (Signed)
Left message on answering machine. 

## 2015-07-03 ENCOUNTER — Encounter: Payer: Self-pay | Admitting: Internal Medicine

## 2017-12-08 ENCOUNTER — Other Ambulatory Visit: Payer: Self-pay | Admitting: Ophthalmology

## 2017-12-08 ENCOUNTER — Ambulatory Visit
Admission: RE | Admit: 2017-12-08 | Discharge: 2017-12-08 | Disposition: A | Discharge status: Home / Self Care | Payer: BLUE CROSS/BLUE SHIELD | Source: Ambulatory Visit | Attending: Ophthalmology | Admitting: Ophthalmology

## 2017-12-08 DIAGNOSIS — S0551XA Penetrating wound with foreign body of right eyeball, initial encounter: Secondary | ICD-10-CM

## 2017-12-08 DIAGNOSIS — S00251A Superficial foreign body of right eyelid and periocular area, initial encounter: Secondary | ICD-10-CM

## 2018-01-11 ENCOUNTER — Ambulatory Visit: Payer: BLUE CROSS/BLUE SHIELD | Admitting: Emergency Medicine

## 2018-01-11 ENCOUNTER — Encounter: Payer: Self-pay | Admitting: Emergency Medicine

## 2018-01-11 ENCOUNTER — Ambulatory Visit (INDEPENDENT_AMBULATORY_CARE_PROVIDER_SITE_OTHER): Payer: BLUE CROSS/BLUE SHIELD

## 2018-01-11 ENCOUNTER — Other Ambulatory Visit: Payer: Self-pay

## 2018-01-11 VITALS — BP 110/70 | HR 80 | Temp 98.2°F | Resp 16 | Ht 70.25 in | Wt 218.4 lb

## 2018-01-11 DIAGNOSIS — R1011 Right upper quadrant pain: Secondary | ICD-10-CM | POA: Diagnosis not present

## 2018-01-11 DIAGNOSIS — M7918 Myalgia, other site: Secondary | ICD-10-CM | POA: Diagnosis not present

## 2018-01-11 DIAGNOSIS — M549 Dorsalgia, unspecified: Secondary | ICD-10-CM | POA: Diagnosis not present

## 2018-01-11 DIAGNOSIS — R10A1 Flank pain, right side: Secondary | ICD-10-CM | POA: Insufficient documentation

## 2018-01-11 DIAGNOSIS — G8929 Other chronic pain: Secondary | ICD-10-CM | POA: Diagnosis not present

## 2018-01-11 DIAGNOSIS — R109 Unspecified abdominal pain: Secondary | ICD-10-CM | POA: Diagnosis not present

## 2018-01-11 DIAGNOSIS — M25521 Pain in right elbow: Secondary | ICD-10-CM | POA: Diagnosis not present

## 2018-01-11 DIAGNOSIS — M7711 Lateral epicondylitis, right elbow: Secondary | ICD-10-CM | POA: Diagnosis not present

## 2018-01-11 LAB — POCT CBC
GRANULOCYTE PERCENT: 75.4 % (ref 37–80)
HEMATOCRIT: 42.3 % — AB (ref 43.5–53.7)
Hemoglobin: 14.3 g/dL (ref 14.1–18.1)
LYMPH, POC: 1.1 (ref 0.6–3.4)
MCH, POC: 34 pg — AB (ref 27–31.2)
MCHC: 33.8 g/dL (ref 31.8–35.4)
MCV: 100.5 fL — AB (ref 80–97)
MID (cbc): 0.1 (ref 0–0.9)
MPV: 6.9 fL (ref 0–99.8)
POC GRANULOCYTE: 3.8 (ref 2–6.9)
POC LYMPH PERCENT: 22.6 %L (ref 10–50)
POC MID %: 2 % (ref 0–12)
Platelet Count, POC: 273 10*3/uL (ref 142–424)
RBC: 4.21 M/uL — AB (ref 4.69–6.13)
RDW, POC: 12.6 %
WBC: 5 10*3/uL (ref 4.6–10.2)

## 2018-01-11 LAB — POCT URINALYSIS DIP (MANUAL ENTRY)
Bilirubin, UA: NEGATIVE
Blood, UA: NEGATIVE
Glucose, UA: NEGATIVE mg/dL
Ketones, POC UA: NEGATIVE mg/dL
Leukocytes, UA: NEGATIVE
NITRITE UA: NEGATIVE
Protein Ur, POC: NEGATIVE mg/dL
Spec Grav, UA: 1.015 (ref 1.010–1.025)
Urobilinogen, UA: 0.2 E.U./dL
pH, UA: 6 (ref 5.0–8.0)

## 2018-01-11 MED ORDER — DICLOFENAC SODIUM 75 MG PO TBEC: 75.0000 mg | DELAYED_RELEASE_TABLET | Freq: Two times a day (BID) | ORAL | 0 refills | Status: AC

## 2018-01-11 MED ORDER — CYCLOBENZAPRINE HCL 10 MG PO TABS: 10.0000 mg | ORAL_TABLET | Freq: Every day | ORAL | 0 refills | Status: AC

## 2018-01-11 NOTE — Patient Instructions (Addendum)
     IF you received an x-ray today, you will receive an invoice from [facility]. Please contact  Radiology at [phone] with questions or concerns regarding your invoice.   IF you received labwork today, you will receive an invoice from [facility]. Please contact [facility] at [phone] with questions or concerns regarding your invoice.   Our billing staff will not be able to assist you with questions regarding bills from these companies.  You will be contacted with the lab results as soon as they are available. The fastest way to get your results is to activate your My Chart account. Instructions are located on the last page of this paperwork. If you have not heard from us regarding the results in 2 weeks, please contact this office.    Flank Pain Flank pain is pain in your side. The flank is the area of your side between your upper belly (abdomen) and your back. The pain may occur over a short period of time (acute) or may be long-term or come back often (chronic). It may be mild or very bad. Pain in this area can be caused by many different things. Follow these instructions at home:  Rest as told by your doctor.  Drink enough fluid to keep your pee (urine) clear or pale yellow.  Take over-the-counter and prescription medicines only as told by your doctor.  Keep all follow-up visits as told by your doctor. This is important. Contact a doctor if:  Medicine does not help your pain.  You have new symptoms.  Your pain gets worse.  You have a fever.  Your symptoms last longer than 2-3 days. Get help right away if:  Your tummy hurts or is swollen.  You are short of breath.  You feel sick to your stomach (nauseous) and it does not go away.  You cannot stop throwing up (vomiting).  You feel like you will pass out or you do pass out (faint).  You have blood in your pee.  You have a fever and your symptoms suddenly get worse. This information is not  intended to replace advice given to you by your health care provider. Make sure you discuss any questions you have with your health care provider. Document Released: 04/15/2008 Document Revised: 03/28/2016 Document Reviewed: 04/10/2015 Elsevier Interactive Patient Education  2018 Elsevier Inc.  

## 2018-01-11 NOTE — Progress Notes (Signed)
Logan Kim 57 y.o.   Chief Complaint  Patient presents with  . Back Pain    x 1 month radiating to the from stomach, DOES NOT WANT TO ESTABLISH CARE  . Elbow Pain    x 3 months - RIGHT    HISTORY OF PRESENT ILLNESS: This is a 57 y.o. male complaining of 2 things: 1.  Chronic pain to right elbow.  Has been diagnosed with lateral epicondylitis in the past.  Patient works as a Games developer.  Occasionally plays golf.  In the past has been tried with corticosteroids and NSAIDs. 2.  Pain to his right flank area for the past 3 months but worse the past 2 weeks.  Constant pain with no radiation no associated symptoms.  Has some concerns about kidney stones but has no prior history.  Denies blood in the urine, nausea or vomiting, abdominal pain or any other significant symptoms.  HPI   Prior to Admission medications   Medication Sig Start Date End Date Taking? Authorizing Provider  ibuprofen (ADVIL,MOTRIN) 200 MG tablet Take 200 mg by mouth 2 (two) times daily.   Yes [provider]  losartan-hydrochlorothiazide (HYZAAR) 100-12.5 MG per tablet Take 0.5 tablets by mouth daily.   Yes [provider]  Multiple Vitamins-Minerals (MULTIVITAMIN PO) Take 1 tablet by mouth daily.   Yes [provider]  OVER THE COUNTER MEDICATION Apply 1 drop to eye every morning. Redness/allergy eye drop   Yes [provider]  OVER THE COUNTER MEDICATION daily.   Yes [provider]  Na Sulfate-K Sulfate-Mg Sulf SOLN Take 1 kit by mouth once. Patient not taking: Reported on 01/11/2018 04/26/15   Irene Shipper, MD  naproxen sodium (ALEVE) 220 MG tablet Take 220 mg by mouth as needed.    [provider]    Allergies  Allergen Reactions  . Penicillins Rash    There are no active problems to display for this patient.   Past Medical History:  Diagnosis Date  . Arthritis   . Cholecystitis   . GERD (gastroesophageal reflux disease)    otc meds,Tums  .  Hyperlipidemia   . Hypertension     Past Surgical History:  Procedure Laterality Date  . CHOLECYSTECTOMY N/A 04/05/2014   Procedure: LAPAROSCOPIC CHOLECYSTECTOMY ;  Surgeon: Georganna Skeans, MD;  Location: [facility] [place];  Service: General;  Laterality: N/A;  . COSMETIC SURGERY Right    great right toe injury with lawn mower    Social History   Socioeconomic History  . Marital status: Single    Spouse name: Not on file  . Number of children: 0  . Years of education: Not on file  . Highest education level: Not on file  Occupational History  . Occupation: Games developer  Social Needs  . Financial resource strain: Not on file  . Food insecurity:    Worry: Not on file    Inability: Not on file  . Transportation needs:    Medical: Not on file    Non-medical: Not on file  Tobacco Use  . Smoking status: Former Smoker    Packs/day: 1.00    Years: 30.00    Pack years: 30.00    Types: Cigarettes    Last attempt to quit: 04/04/2006    Years since quitting: 11.7  . Smokeless tobacco: Never Used  Substance and Sexual Activity  . Alcohol use: Yes    Comment: beer  . Drug use: No  . Sexual activity: Not on file  Lifestyle  .  Physical activity:    Days per week: Not on file    Minutes per session: Not on file  . Stress: Not on file  Relationships  . Social connections:    Talks on phone: Not on file    Gets together: Not on file    Attends religious service: Not on file    Active member of club or organization: Not on file    Attends meetings of clubs or organizations: Not on file    Relationship status: Not on file  . Intimate partner violence:    Fear of current or ex partner: Not on file    Emotionally abused: Not on file    Physically abused: Not on file    Forced sexual activity: Not on file  Other Topics Concern  . Not on file  Social History Narrative  . Not on file    Family History  Problem Relation Age of Onset  . Breast cancer Mother   . Colon cancer Maternal Aunt     . Heart disease Father   . Heart disease Paternal Grandfather      Review of Systems  Constitutional: Positive for weight loss. Negative for chills and fever.  HENT: Negative.  Negative for congestion, nosebleeds and sore throat.   Eyes: Negative.  Negative for blurred vision and double vision.  Respiratory: Negative.  Negative for cough and shortness of breath.   Cardiovascular: Negative.  Negative for chest pain and palpitations.  Gastrointestinal: Positive for abdominal pain. Negative for blood in stool, diarrhea, melena, nausea and vomiting.  Genitourinary: Positive for flank pain. Negative for dysuria and hematuria.  Musculoskeletal: Positive for joint pain. Negative for myalgias.  Skin: Negative for rash.  Neurological: Negative.  Negative for dizziness, sensory change, focal weakness and headaches.  Endo/Heme/Allergies: Negative.   All other systems reviewed and are negative.  Vitals:   01/11/18 1400  BP: 110/70  Pulse: 80  Resp: 16  Temp: 98.2 F (36.8 C)  SpO2: 98%     Physical Exam  Constitutional: He is oriented to person, place, and time. He appears well-developed and well-nourished.  HENT:  Head: Normocephalic and atraumatic.  Right Ear: External ear normal.  Left Ear: External ear normal.  Mouth/Throat: Oropharynx is clear and moist.  Eyes: Pupils are equal, round, and reactive to light. Conjunctivae and EOM are normal.  Neck: Normal range of motion. Neck supple. No thyromegaly present.  Cardiovascular: Normal rate, regular rhythm and normal heart sounds.  Pulmonary/Chest: Effort normal and breath sounds normal.  Abdominal: Soft. Bowel sounds are normal. He exhibits no distension. There is no tenderness.  Musculoskeletal: He exhibits no edema.       Lumbar back: He exhibits decreased range of motion, tenderness and spasm. He exhibits no bony tenderness, no swelling and normal pulse.       Back:  Right flank area: Some tenderness and muscle spasm down into  right lumbar area as well Right elbow: Positive tenderness to lateral epicondyles.  Full range of motion  Lymphadenopathy:    He has no cervical adenopathy.  Neurological: He is alert and oriented to person, place, and time. No sensory deficit. He exhibits normal muscle tone. Coordination normal.  Skin: Skin is warm and dry. Capillary refill takes less than 2 seconds. No rash noted.  Psychiatric: He has a normal mood and affect. His behavior is normal.  Vitals reviewed.    Results for orders placed or performed in visit on 01/11/18 (from the past 24 hour(s))  POCT urinalysis dipstick     Status: None   Collection Time: 01/11/18  2:21 PM  Result Value Ref Range   Color, UA yellow yellow   Clarity, UA clear clear   Glucose, UA negative negative mg/dL   Bilirubin, UA negative negative   Ketones, POC UA negative negative mg/dL   Spec Grav, UA 1.015 1.010 - 1.025   Blood, UA negative negative   pH, UA 6.0 5.0 - 8.0   Protein Ur, POC negative negative mg/dL   Urobilinogen, UA 0.2 0.2 or 1.0 E.U./dL   Nitrite, UA Negative Negative   Leukocytes, UA Negative Negative  POCT CBC     Status: Abnormal   Collection Time: 01/11/18  3:04 PM  Result Value Ref Range   WBC 5.0 4.6 - 10.2 K/uL   Lymph, poc 1.1 0.6 - 3.4   POC LYMPH PERCENT 22.6 10 - 50 %L   MID (cbc) 0.1 0 - 0.9   POC MID % 2.0 0 - 12 %M   POC Granulocyte 3.8 2 - 6.9   Granulocyte percent 75.4 37 - 80 %G   RBC 4.21 (A) 4.69 - 6.13 M/uL   Hemoglobin 14.3 14.1 - 18.1 g/dL   HCT, POC 42.3 (A) 43.5 - 53.7 %   MCV 100.5 (A) 80 - 97 fL   MCH, POC 34.0 (A) 27 - 31.2 pg   MCHC 33.8 31.8 - 35.4 g/dL   RDW, POC 12.6 %   Platelet Count, POC 273 142 - 424 K/uL   MPV 6.9 0 - 99.8 fL   Dg Abd 1 View  Result Date: 01/11/2018 CLINICAL DATA:  Right-sided pain. EXAM: ABDOMEN - 1 VIEW COMPARISON:  None. FINDINGS: Cholecystectomy clips identified. The bowel gas pattern is unremarkable. No cause for abdominal pain noted. The lung bases are  normal. IMPRESSION: Negative. Electronically Signed   By: Dorise Bullion III M.D   On: 01/11/2018 14:43   A total of 30 minutes was spent in the room with the patient, greater than 50% of which was in counseling/coordination of care regarding differential diagnosis, treatment, medications, diagnostic imaging, and need for follow-up.   ASSESSMENT & PLAN: Jaeven was seen today for back pain and elbow pain.  Diagnoses and all orders for this visit:  Acute right-sided back pain, unspecified back location -     POCT urinalysis dipstick -     POCT CBC -     Comprehensive metabolic panel -     DG Abd 1 View; Future -     Lipase  Right upper quadrant abdominal pain -     CT Abdomen Pelvis Wo Contrast; Future  Chronic elbow pain, right  Lateral epicondylitis of right elbow  Right flank pain  Musculoskeletal pain  Other orders -     cyclobenzaprine (FLEXERIL) 10 MG tablet; Take 1 tablet (10 mg total) by mouth at bedtime. -     diclofenac (VOLTAREN) 75 MG EC tablet; Take 1 tablet (75 mg total) by mouth 2 (two) times daily for 5 days. After 5 days take as needed.   Patient Instructions       IF you received an x-ray today, you will receive an invoice from [facility]. Please contact [facility] at [phone] with questions or concerns regarding your invoice.   IF you received labwork today, you will receive an invoice from [facility]. Please contact [facility] at [phone] with questions or concerns regarding your invoice.   Our billing staff will not be able to assist  you with questions regarding bills from these companies.  You will be contacted with the lab results as soon as they are available. The fastest way to get your results is to activate your My Chart account. Instructions are located on the last page of this paperwork. If you have not heard from [place] regarding the results in 2 weeks, please contact this office.    Flank Pain Flank pain is pain in your  side. The flank is the area of your side between your upper belly (abdomen) and your back. The pain may occur over a short period of time (acute) or may be long-term or come back often (chronic). It may be mild or very bad. Pain in this area can be caused by many different things. Follow these instructions at home:  Rest as told by your doctor.  Drink enough fluid to keep your pee (urine) clear or pale yellow.  Take over-the-counter and prescription medicines only as told by your doctor.  Keep all follow-up visits as told by your doctor. This is important. Contact a doctor if:  Medicine does not help your pain.  You have new symptoms.  Your pain gets worse.  You have a fever.  Your symptoms last longer than 2-3 days. Get help right away if:  Your tummy hurts or is swollen.  You are short of breath.  You feel sick to your stomach (nauseous) and it does not go away.  You cannot stop throwing up (vomiting).  You feel like you will pass out or you do pass out (faint).  You have blood in your pee.  You have a fever and your symptoms suddenly get worse. This information is not intended to replace advice given to you by your health care provider. Make sure you discuss any questions you have with your health care provider. Document Released: 04/15/2008 Document Revised: 03/28/2016 Document Reviewed: 04/10/2015 Elsevier Interactive Patient Education  2018 Elsevier Inc.      Agustina Caroli, MD [facility]

## 2018-01-12 ENCOUNTER — Encounter: Payer: Self-pay | Admitting: Radiology

## 2018-01-12 LAB — COMPREHENSIVE METABOLIC PANEL
A/G RATIO: 1.4 (ref 1.2–2.2)
ALBUMIN: 4.2 g/dL (ref 3.5–5.5)
ALT: 26 IU/L (ref 0–44)
AST: 32 IU/L (ref 0–40)
Alkaline Phosphatase: 71 IU/L (ref 39–117)
BUN/Creatinine Ratio: 15 (ref 9–20)
BUN: 18 mg/dL (ref 6–24)
Bilirubin Total: 1 mg/dL (ref 0.0–1.2)
CALCIUM: 9.3 mg/dL (ref 8.7–10.2)
CO2: 25 mmol/L (ref 20–29)
Chloride: 102 mmol/L (ref 96–106)
Creatinine, Ser: 1.23 mg/dL (ref 0.76–1.27)
GFR, EST AFRICAN AMERICAN: 75 mL/min/{1.73_m2} (ref >59)
GFR, EST NON AFRICAN AMERICAN: 65 mL/min/{1.73_m2} (ref >59)
GLOBULIN, TOTAL: 2.9 g/dL (ref 1.5–4.5)
Glucose: 114 mg/dL — ABNORMAL HIGH (ref 65–99)
Potassium: 3.8 mmol/L (ref 3.5–5.2)
Sodium: 145 mmol/L — ABNORMAL HIGH (ref 134–144)
TOTAL PROTEIN: 7.1 g/dL (ref 6.0–8.5)

## 2018-01-12 LAB — LIPASE: Lipase: 50 U/L (ref 13–78)

## 2018-01-15 ENCOUNTER — Other Ambulatory Visit: Payer: Self-pay | Admitting: Emergency Medicine

## 2018-01-15 ENCOUNTER — Telehealth: Payer: Self-pay | Admitting: Emergency Medicine

## 2018-01-15 DIAGNOSIS — R109 Unspecified abdominal pain: Secondary | ICD-10-CM

## 2018-01-15 NOTE — Telephone Encounter (Signed)
Tried to receive prior auth through the insurance for the ct abd and pelvis wo contrast. The insurance approved the ct abd but denied the ct pelvis. I declined the request until I see what you want to do. If you want to proceed with the ct abd an order for just ct abd would have to be put in. Thank you.

## 2018-01-15 NOTE — Telephone Encounter (Signed)
New order entered. Thanks

## 2018-01-16 ENCOUNTER — Encounter: Payer: Self-pay | Admitting: Emergency Medicine

## 2018-01-18 NOTE — Telephone Encounter (Signed)
Hey Dr. Mitchel Kim so the ct order needs to be ct abdomen only no pelvis, in order for the insurance to approve it. Thanks

## 2018-01-20 ENCOUNTER — Ambulatory Visit
Admission: RE | Admit: 2018-01-20 | Discharge: 2018-01-20 | Disposition: A | Discharge status: Home / Self Care | Payer: BLUE CROSS/BLUE SHIELD | Source: Ambulatory Visit | Attending: Emergency Medicine | Admitting: Emergency Medicine

## 2018-01-20 DIAGNOSIS — R1011 Right upper quadrant pain: Secondary | ICD-10-CM

## 2018-01-23 ENCOUNTER — Encounter: Payer: Self-pay | Admitting: Radiology

## 2018-01-27 ENCOUNTER — Ambulatory Visit (INDEPENDENT_AMBULATORY_CARE_PROVIDER_SITE_OTHER): Payer: BLUE CROSS/BLUE SHIELD | Admitting: Emergency Medicine

## 2018-01-27 ENCOUNTER — Other Ambulatory Visit: Payer: Self-pay

## 2018-01-27 ENCOUNTER — Encounter: Payer: Self-pay | Admitting: Emergency Medicine

## 2018-01-27 VITALS — BP 115/71 | HR 89 | Temp 98.2°F | Resp 16 | Ht 70.5 in | Wt 212.6 lb

## 2018-01-27 DIAGNOSIS — Z0000 Encounter for general adult medical examination without abnormal findings: Secondary | ICD-10-CM

## 2018-01-27 NOTE — Progress Notes (Signed)
Logan Kim 57 y.o.   Chief Complaint  Patient presents with  . Annual Exam    HISTORY OF PRESENT ILLNESS: This is a 57 y.o. male Here for annual exam; no complaints and no medical concerns. Seen by me 01/11/2018 for right sided back pain.  Had normal blood work.  CT scan of abdomen and pelvis was unremarkable.  Results reviewed with patient in the room today.  No new complaints.  Still has some residual persistent back pain but not like before.   HPI   Prior to Admission medications   Medication Sig Start Date End Date Taking? Authorizing Provider  cyclobenzaprine (FLEXERIL) 10 MG tablet Take 1 tablet (10 mg total) by mouth at bedtime. 01/11/18  Yes [employer], Ines Bloomer, MD  ibuprofen (ADVIL,MOTRIN) 200 MG tablet Take 200 mg by mouth 2 (two) times daily.   Yes [provider]  losartan-hydrochlorothiazide (HYZAAR) 100-12.5 MG per tablet Take 0.5 tablets by mouth daily.   Yes [provider]  Multiple Vitamins-Minerals (MULTIVITAMIN PO) Take 1 tablet by mouth daily.   Yes [provider]  Na Sulfate-K Sulfate-Mg Sulf SOLN Take 1 kit by mouth once. 04/26/15  Yes Irene Shipper, MD  OVER THE COUNTER MEDICATION Apply 1 drop to eye every morning. Redness/allergy eye drop   Yes [provider]  OVER THE COUNTER MEDICATION daily.   Yes [provider]  naproxen sodium (ALEVE) 220 MG tablet Take 220 mg by mouth as needed.    [provider]    Allergies  Allergen Reactions  . Penicillins Rash    Patient Active Problem List   Diagnosis Date Noted  . Right upper quadrant abdominal pain 01/11/2018  . Right flank pain 01/11/2018    Past Medical History:  Diagnosis Date  . Arthritis   . Cholecystitis   . GERD (gastroesophageal reflux disease)    otc meds,Tums  . Hyperlipidemia   . Hypertension     Past Surgical History:  Procedure Laterality Date  . CHOLECYSTECTOMY N/A 04/05/2014   Procedure: LAPAROSCOPIC CHOLECYSTECTOMY ;   Surgeon: Georganna Skeans, MD;  Location: [facility];  Service: General;  Laterality: N/A;  . COSMETIC SURGERY Right    great right toe injury with lawn mower    Social History   Socioeconomic History  . Marital status: Single    Spouse name: Not on file  . Number of children: 0  . Years of education: Not on file  . Highest education level: Not on file  Occupational History  . Occupation: Games developer  Social Needs  . Financial resource strain: Not on file  . Food insecurity:    Worry: Not on file    Inability: Not on file  . Transportation needs:    Medical: Not on file    Non-medical: Not on file  Tobacco Use  . Smoking status: Former Smoker    Packs/day: 1.00    Years: 30.00    Pack years: 30.00    Types: Cigarettes    Last attempt to quit: 04/04/2006    Years since quitting: 11.8  . Smokeless tobacco: Never Used  Substance and Sexual Activity  . Alcohol use: Yes    Comment: beer  . Drug use: No  . Sexual activity: Not on file  Lifestyle  . Physical activity:    Days per week: Not on file    Minutes per session: Not on file  . Stress: Not on file  Relationships  . Social connections:  Talks on phone: Not on file    Gets together: Not on file    Attends religious service: Not on file    Active member of club or organization: Not on file    Attends meetings of clubs or organizations: Not on file    Relationship status: Not on file  . Intimate partner violence:    Fear of current or ex partner: Not on file    Emotionally abused: Not on file    Physically abused: Not on file    Forced sexual activity: Not on file  Other Topics Concern  . Not on file  Social History Narrative  . Not on file    Family History  Problem Relation Age of Onset  . Breast cancer Mother   . Colon cancer Maternal Aunt   . Heart disease Father   . Heart disease Paternal Grandfather      Review of Systems  Constitutional: Negative.  Negative for chills and fever.  HENT: Negative.   Negative for hearing loss and sore throat.   Eyes: Negative.  Negative for blurred vision and double vision.  Respiratory: Negative.  Negative for cough, shortness of breath and wheezing.   Cardiovascular: Negative.  Negative for chest pain, palpitations and leg swelling.  Gastrointestinal: Negative.  Negative for abdominal pain, blood in stool, diarrhea, melena, nausea and vomiting.  Genitourinary: Negative.  Negative for dysuria and hematuria.  Musculoskeletal: Positive for back pain.  Skin: Negative.  Negative for rash.  Neurological: Negative.  Negative for dizziness, focal weakness, weakness and headaches.  Endo/Heme/Allergies: Negative.   All other systems reviewed and are negative.   Vitals:   01/27/18 1346  BP: 115/71  Pulse: 89  Resp: 16  Temp: 98.2 F (36.8 C)  SpO2: 98%    Physical Exam  Constitutional: He is oriented to person, place, and time. He appears well-developed and well-nourished.  HENT:  Head: Normocephalic and atraumatic.  Right Ear: External ear normal.  Left Ear: External ear normal.  Nose: Nose normal.  Mouth/Throat: Oropharynx is clear and moist.  Eyes: Pupils are equal, round, and reactive to light. EOM are normal.  Neck: Normal range of motion. Neck supple. No JVD present. No thyromegaly present.  Cardiovascular: Normal rate, regular rhythm, normal heart sounds and intact distal pulses.  Pulmonary/Chest: Effort normal and breath sounds normal.  Abdominal: Soft. Bowel sounds are normal. He exhibits no distension. There is no tenderness.  Musculoskeletal: Normal range of motion. He exhibits no edema or tenderness.  Lymphadenopathy:    He has no cervical adenopathy.  Neurological: He is alert and oriented to person, place, and time. No sensory deficit. He exhibits normal muscle tone.  Skin: Skin is warm and dry. Capillary refill takes less than 2 seconds. No rash noted.  Psychiatric: He has a normal mood and affect. His behavior is normal.  Vitals  reviewed.    ASSESSMENT & PLAN: [name] was seen today for annual exam.  Diagnoses and all orders for this visit:  Routine general medical examination at a health care facility -     Hemoglobin A1c -     Lipid panel -     PSA(Must document that pt has been informed of limitations of PSA testing.) -     Hepatitis C antibody screen -     HIV antibody    Patient Instructions       IF you received an x-ray today, you will receive an invoice from [facility]. Please contact [facility]  at [phone] with questions or concerns regarding your invoice.   IF you received labwork today, you will receive an invoice from [facility]. Please contact [facility] at [phone] with questions or concerns regarding your invoice.   Our billing staff will not be able to assist you with questions regarding bills from these companies.  You will be contacted with the lab results as soon as they are available. The fastest way to get your results is to activate your My Chart account. Instructions are located on the last page of this paperwork. If you have not heard from [place] regarding the results in 2 weeks, please contact this office.      Health Maintenance, Male A healthy lifestyle and preventive care is important for your health and wellness. Ask your health care provider about what schedule of regular examinations is right for you. What should I know about weight and diet? Eat a Healthy Diet  Eat plenty of vegetables, fruits, whole grains, low-fat dairy products, and lean protein.  Do not eat a lot of foods high in solid fats, added sugars, or salt.  Maintain a Healthy Weight Regular exercise can help you achieve or maintain a healthy weight. You should:  Do at least 150 minutes of exercise each week. The exercise should increase your heart rate and make you sweat (moderate-intensity exercise).  Do strength-training exercises at least twice a week.  Watch Your Levels of  Cholesterol and Blood Lipids  Have your blood tested for lipids and cholesterol every 5 years starting at 57 years of age. If you are at high risk for heart disease, you should start having your blood tested when you are 57 years old. You may need to have your cholesterol levels checked more often if: ? Your lipid or cholesterol levels are high. ? You are older than 57 years of age. ? You are at high risk for heart disease.  What should I know about cancer screening? Many types of cancers can be detected early and may often be prevented. Lung Cancer  You should be screened every year for lung cancer if: ? You are a current smoker who has smoked for at least 30 years. ? You are a former smoker who has quit within the past 15 years.  Talk to your health care provider about your screening options, when you should start screening, and how often you should be screened.  Colorectal Cancer  Routine colorectal cancer screening usually begins at 57 years of age and should be repeated every 5-10 years until you are 57 years old. You may need to be screened more often if early forms of precancerous polyps or small growths are found. Your health care provider may recommend screening at an earlier age if you have risk factors for colon cancer.  Your health care provider may recommend using home test kits to check for hidden blood in the stool.  A small camera at the end of a tube can be used to examine your colon (sigmoidoscopy or colonoscopy). This checks for the earliest forms of colorectal cancer.  Prostate and Testicular Cancer  Depending on your age and overall health, your health care provider may do certain tests to screen for prostate and testicular cancer.  Talk to your health care provider about any symptoms or concerns you have about testicular or prostate cancer.  Skin Cancer  Check your skin from head to toe regularly.  Tell your health care provider about any new moles or changes  in moles, especially  if: ? There is a change in a mole's size, shape, or color. ? You have a mole that is larger than a pencil eraser.  Always use sunscreen. Apply sunscreen liberally and repeat throughout the day.  Protect yourself by wearing long sleeves, pants, a wide-brimmed hat, and sunglasses when outside.  What should I know about heart disease, diabetes, and high blood pressure?  If you are 36-7 years of age, have your blood pressure checked every 3-5 years. If you are 77 years of age or older, have your blood pressure checked every year. You should have your blood pressure measured twice-once when you are at a hospital or clinic, and once when you are not at a hospital or clinic. Record the average of the two measurements. To check your blood pressure when you are not at a hospital or clinic, you can use: ? An automated blood pressure machine at a pharmacy. ? A home blood pressure monitor.  Talk to your health care provider about your target blood pressure.  If you are between 48-78 years old, ask your health care provider if you should take aspirin to prevent heart disease.  Have regular diabetes screenings by checking your fasting blood sugar level. ? If you are at a normal weight and have a low risk for diabetes, have this test once every three years after the age of 15. ? If you are overweight and have a high risk for diabetes, consider being tested at a younger age or more often.  A one-time screening for abdominal aortic aneurysm (AAA) by ultrasound is recommended for men aged 17-75 years who are current or former smokers. What should I know about preventing infection? Hepatitis B If you have a higher risk for hepatitis B, you should be screened for this virus. Talk with your health care provider to find out if you are at risk for hepatitis B infection. Hepatitis C Blood testing is recommended for:  Everyone born from [birth year] through 1965.  Anyone with known risk factors  for hepatitis C.  Sexually Transmitted Diseases (STDs)  You should be screened each year for STDs including gonorrhea and chlamydia if: ? You are sexually active and are younger than 57 years of age. ? You are older than 57 years of age and your health care provider tells you that you are at risk for this type of infection. ? Your sexual activity has changed since you were last screened and you are at an increased risk for chlamydia or gonorrhea. Ask your health care provider if you are at risk.  Talk with your health care provider about whether you are at high risk of being infected with HIV. Your health care provider may recommend a prescription medicine to help prevent HIV infection.  What else can I do?  Schedule regular health, dental, and eye exams.  Stay current with your vaccines (immunizations).  Do not use any tobacco products, such as cigarettes, chewing tobacco, and e-cigarettes. If you need help quitting, ask your health care provider.  Limit alcohol intake to no more than 2 drinks per day. One drink equals 12 ounces of beer, 5 ounces of wine, or 1 ounces of hard liquor.  Do not use street drugs.  Do not share needles.  Ask your health care provider for help if you need support or information about quitting drugs.  Tell your health care provider if you often feel depressed.  Tell your health care provider if you have ever been abused or do not  feel safe at home. This information is not intended to replace advice given to you by your health care provider. Make sure you discuss any questions you have with your health care provider. Document Released: 01/03/2008 Document Revised: 03/05/2016 Document Reviewed: 04/10/2015 Elsevier Interactive Patient Education  2018 Elsevier Inc.      Agustina Caroli, MD [facility]

## 2018-01-27 NOTE — Patient Instructions (Addendum)
   IF you received an x-ray today, you will receive an invoice from [facility]. Please contact [facility] at [phone] with questions or concerns regarding your invoice.   IF you received labwork today, you will receive an invoice from [facility]. Please contact [facility] at [phone] with questions or concerns regarding your invoice.   Our billing staff will not be able to assist you with questions regarding bills from these companies.  You will be contacted with the lab results as soon as they are available. The fastest way to get your results is to activate your My Chart account. Instructions are located on the last page of this paperwork. If you have not heard from us regarding the results in 2 weeks, please contact this office.      Health Maintenance, Male A healthy lifestyle and preventive care is important for your health and wellness. Ask your health care provider about what schedule of regular examinations is right for you. What should I know about weight and diet? Eat a Healthy Diet  Eat plenty of vegetables, fruits, whole grains, low-fat dairy products, and lean protein.  Do not eat a lot of foods high in solid fats, added sugars, or salt.  Maintain a Healthy Weight Regular exercise can help you achieve or maintain a healthy weight. You should:  Do at least 150 minutes of exercise each week. The exercise should increase your heart rate and make you sweat (moderate-intensity exercise).  Do strength-training exercises at least twice a week.  Watch Your Levels of Cholesterol and Blood Lipids  Have your blood tested for lipids and cholesterol every 5 years starting at 57 years of age. If you are at high risk for heart disease, you should start having your blood tested when you are 57 years old. You may need to have your cholesterol levels checked more often if: ? Your lipid or cholesterol levels are high. ? You are older than 57 years of age. ? You  are at high risk for heart disease.  What should I know about cancer screening? Many types of cancers can be detected early and may often be prevented. Lung Cancer  You should be screened every year for lung cancer if: ? You are a current smoker who has smoked for at least 30 years. ? You are a former smoker who has quit within the past 15 years.  Talk to your health care provider about your screening options, when you should start screening, and how often you should be screened.  Colorectal Cancer  Routine colorectal cancer screening usually begins at 57 years of age and should be repeated every 5-10 years until you are 57 years old. You may need to be screened more often if early forms of precancerous polyps or small growths are found. Your health care provider may recommend screening at an earlier age if you have risk factors for colon cancer.  Your health care provider may recommend using home test kits to check for hidden blood in the stool.  A small camera at the end of a tube can be used to examine your colon (sigmoidoscopy or colonoscopy). This checks for the earliest forms of colorectal cancer.  Prostate and Testicular Cancer  Depending on your age and overall health, your health care provider may do certain tests to screen for prostate and testicular cancer.  Talk to your health care provider about any symptoms or concerns you have about testicular or prostate cancer.  Skin Cancer  Check your skin   from head to toe regularly.  Tell your health care provider about any new moles or changes in moles, especially if: ? There is a change in a mole's size, shape, or color. ? You have a mole that is larger than a pencil eraser.  Always use sunscreen. Apply sunscreen liberally and repeat throughout the day.  Protect yourself by wearing long sleeves, pants, a wide-brimmed hat, and sunglasses when outside.  What should I know about heart disease, diabetes, and high blood  pressure?  If you are 18-39 years of age, have your blood pressure checked every 3-5 years. If you are 40 years of age or older, have your blood pressure checked every year. You should have your blood pressure measured twice-once when you are at a hospital or clinic, and once when you are not at a hospital or clinic. Record the average of the two measurements. To check your blood pressure when you are not at a hospital or clinic, you can use: ? An automated blood pressure machine at a pharmacy. ? A home blood pressure monitor.  Talk to your health care provider about your target blood pressure.  If you are between 45-79 years old, ask your health care provider if you should take aspirin to prevent heart disease.  Have regular diabetes screenings by checking your fasting blood sugar level. ? If you are at a normal weight and have a low risk for diabetes, have this test once every three years after the age of 45. ? If you are overweight and have a high risk for diabetes, consider being tested at a younger age or more often.  A one-time screening for abdominal aortic aneurysm (AAA) by ultrasound is recommended for men aged 65-75 years who are current or former smokers. What should I know about preventing infection? Hepatitis B If you have a higher risk for hepatitis B, you should be screened for this virus. Talk with your health care provider to find out if you are at risk for hepatitis B infection. Hepatitis C Blood testing is recommended for:  Everyone born from 1945 through 1965.  Anyone with known risk factors for hepatitis C.  Sexually Transmitted Diseases (STDs)  You should be screened each year for STDs including gonorrhea and chlamydia if: ? You are sexually active and are younger than 57 years of age. ? You are older than 57 years of age and your health care provider tells you that you are at risk for this type of infection. ? Your sexual activity has changed since you were last  screened and you are at an increased risk for chlamydia or gonorrhea. Ask your health care provider if you are at risk.  Talk with your health care provider about whether you are at high risk of being infected with HIV. Your health care provider may recommend a prescription medicine to help prevent HIV infection.  What else can I do?  Schedule regular health, dental, and eye exams.  Stay current with your vaccines (immunizations).  Do not use any tobacco products, such as cigarettes, chewing tobacco, and e-cigarettes. If you need help quitting, ask your health care provider.  Limit alcohol intake to no more than 2 drinks per day. One drink equals 12 ounces of beer, 5 ounces of wine, or 1 ounces of hard liquor.  Do not use street drugs.  Do not share needles.  Ask your health care provider for help if you need support or information about quitting drugs.  Tell your health care   provider if you often feel depressed.  Tell your health care provider if you have ever been abused or do not feel safe at home. This information is not intended to replace advice given to you by your health care provider. Make sure you discuss any questions you have with your health care provider. Document Released: 01/03/2008 Document Revised: 03/05/2016 Document Reviewed: 04/10/2015 Elsevier Interactive Patient Education  2018 Elsevier Inc.  

## 2018-01-28 LAB — LIPID PANEL
Chol/HDL Ratio: 3.2 ratio (ref 0.0–5.0)
Cholesterol, Total: 204 mg/dL — ABNORMAL HIGH (ref 100–199)
HDL: 63 mg/dL (ref >39)
LDL Calculated: 123 mg/dL — ABNORMAL HIGH (ref 0–99)
Triglycerides: 90 mg/dL (ref 0–149)
VLDL Cholesterol Cal: 18 mg/dL (ref 5–40)

## 2018-01-28 LAB — HIV ANTIBODY (ROUTINE TESTING W REFLEX): HIV SCREEN 4TH GENERATION: NONREACTIVE

## 2018-01-28 LAB — HEPATITIS C ANTIBODY

## 2018-01-28 LAB — HEMOGLOBIN A1C
Est. average glucose Bld gHb Est-mCnc: 111 mg/dL
Hgb A1c MFr Bld: 5.5 % (ref 4.8–5.6)

## 2018-01-28 LAB — PSA: Prostate Specific Ag, Serum: 6.1 ng/mL — ABNORMAL HIGH (ref 0.0–4.0)

## 2018-01-29 ENCOUNTER — Other Ambulatory Visit: Payer: Self-pay | Admitting: Emergency Medicine

## 2018-01-29 ENCOUNTER — Telehealth: Payer: Self-pay | Admitting: *Deleted

## 2018-01-29 ENCOUNTER — Encounter: Payer: Self-pay | Admitting: Radiology

## 2018-01-29 DIAGNOSIS — R972 Elevated prostate specific antigen [PSA]: Secondary | ICD-10-CM

## 2018-01-29 NOTE — Progress Notes (Signed)
Letter sent.

## 2018-01-29 NOTE — Telephone Encounter (Signed)
Spoke to patient will lab results and make an appointment to follow up in 6-12 months. Letter sent by [facility].

## 2018-05-21 ENCOUNTER — Other Ambulatory Visit: Payer: Self-pay | Admitting: Emergency Medicine

## 2018-05-21 NOTE — Telephone Encounter (Signed)
Requested medication (s) are due for refill today: yes  Requested medication (s) are on the active medication list: yes  Last refill:  04/04/14  Future visit scheduled: no  Notes to clinic:  Historical provider   Requested Prescriptions  Pending Prescriptions Disp Refills   losartan-hydrochlorothiazide (HYZAAR) 100-12.5 MG tablet      Sig: Take 0.5 tablets by mouth daily.     Cardiovascular: ARB + Diuretic Combos Failed - 05/21/2018  1:38 PM      Failed - Na in normal range and within 180 days    Sodium  Date Value Ref Range Status  01/11/2018 145 (H) 134 - 144 mmol/L Final         Passed - K in normal range and within 180 days    Potassium  Date Value Ref Range Status  01/11/2018 3.8 3.5 - 5.2 mmol/L Final         Passed - Cr in normal range and within 180 days    Creatinine, Ser  Date Value Ref Range Status  01/11/2018 1.23 0.76 - 1.27 mg/dL Final         Passed - Ca in normal range and within 180 days    Calcium  Date Value Ref Range Status  01/11/2018 9.3 8.7 - 10.2 mg/dL Final         Passed - Patient is not pregnant      Passed - Last BP in normal range    BP Readings from Last 1 Encounters:  01/27/18 115/71         Passed - Valid encounter within last 6 months    Recent Outpatient Visits          3 months ago Routine general medical examination at a health care facility   [facility] [place], [place], MD   4 months ago Acute right-sided back pain, unspecified back location   [facility] at [facility], Ines Bloomer, MD

## 2018-05-21 NOTE — Telephone Encounter (Unsigned)
Copied from [place] [id number]. Topic: Quick Communication - Rx Refill/Question >> May 21, 2018  1:35 PM Virl Axe D wrote: Medication: losartan-hydrochlorothiazide (HYZAAR) 100-12.5 MG per tablet  Has the patient contacted their pharmacy? No. (Agent: If no, request that the patient contact the pharmacy for the refill.) (Agent: If yes, when and what did the pharmacy advise?)  Preferred Pharmacy (with phone number or street name): [place], [place] [phone] (Phone) [phone] (Fax)    Agent: Please be advised that RX refills may take up to 3 business days. We ask that you follow-up with your pharmacy.

## 2018-05-24 MED ORDER — LOSARTAN POTASSIUM-HCTZ 100-12.5 MG PO TABS: 0.5000 | ORAL_TABLET | Freq: Every day | ORAL | 1 refills | Status: DC

## 2018-05-24 NOTE — Telephone Encounter (Signed)
Pt is requesting Losartan-HCTZ the medication was given to him by a historical provider, you seen him in July. pls advise. thanks

## 2018-08-01 IMAGING — CT CT ABD-PELV W/O CM
1 of 2 series · 14 of 32 positions shown, 19 images · non-contrast
Comparison: None.

CLINICAL DATA: 57-year-old male with RIGHT flank and abdominal pain
for 2 months. History of cholecystectomy.

EXAM:
CT ABDOMEN AND PELVIS WITHOUT CONTRAST
TECHNIQUE: Multidetector CT imaging of the abdomen and pelvis was performed
following the standard protocol without IV contrast.

[Series 2: renal standard/full · axial · 0.96mm/px · z∈[-516,-51]mm · 14 of 107 slices shown, 19 images]
[im 7/107  soft-tissue]
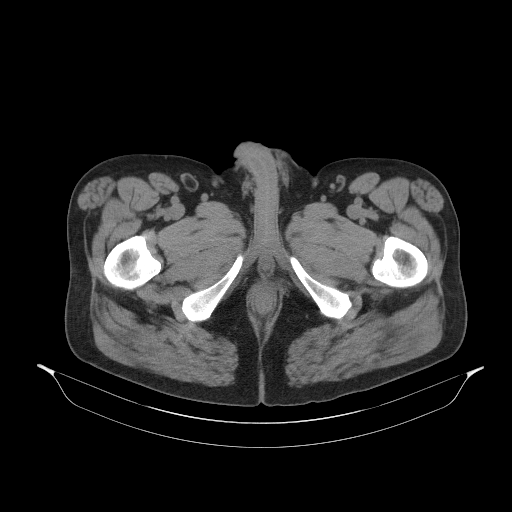
[im 7/107  bone]
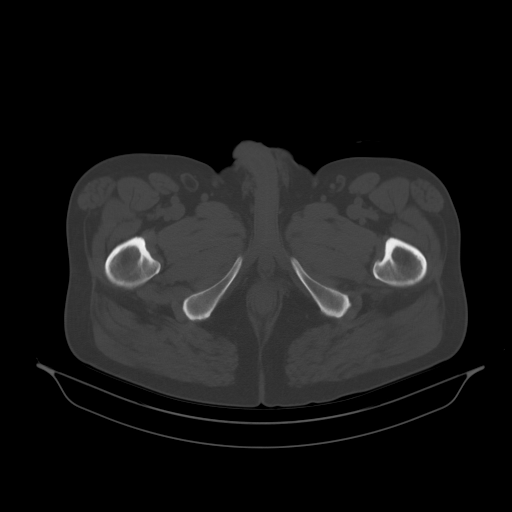
[im 13/107  soft-tissue]
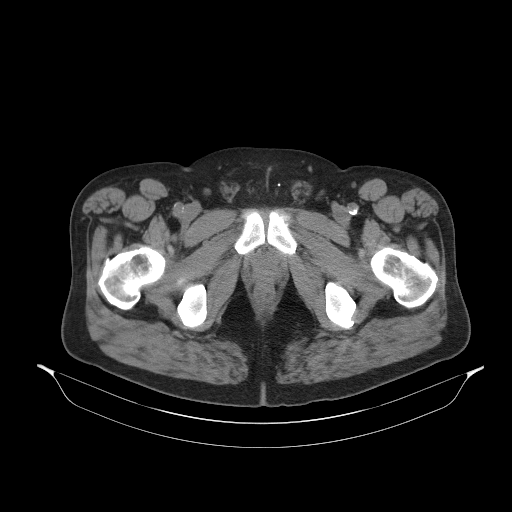
[im 25/107  soft-tissue]
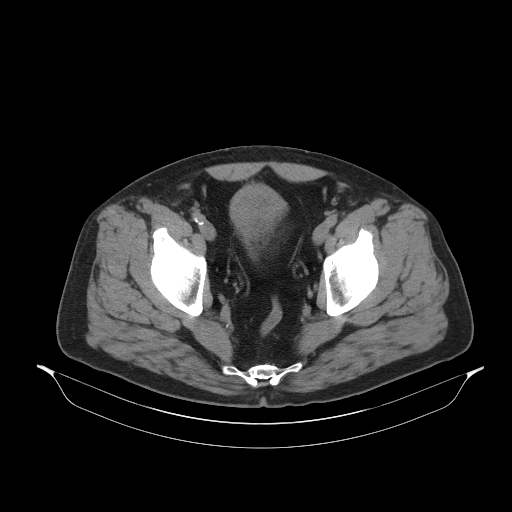
[im 32/107  soft-tissue]
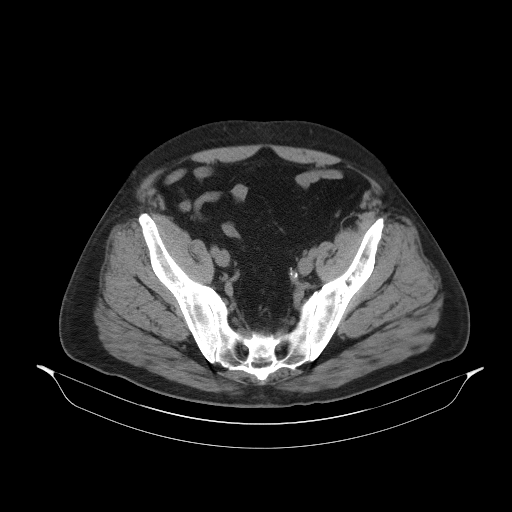
[im 38/107  soft-tissue]
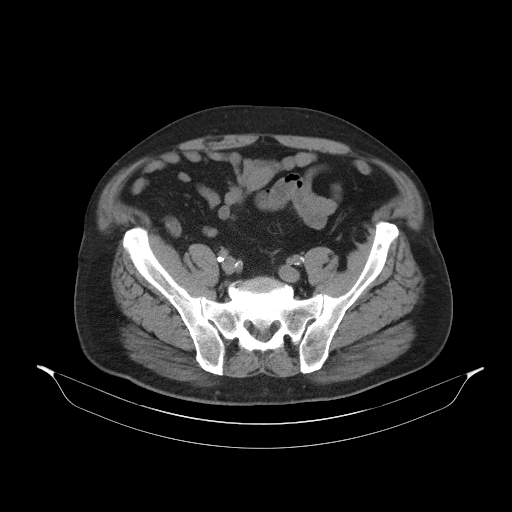
[im 44/107  soft-tissue]
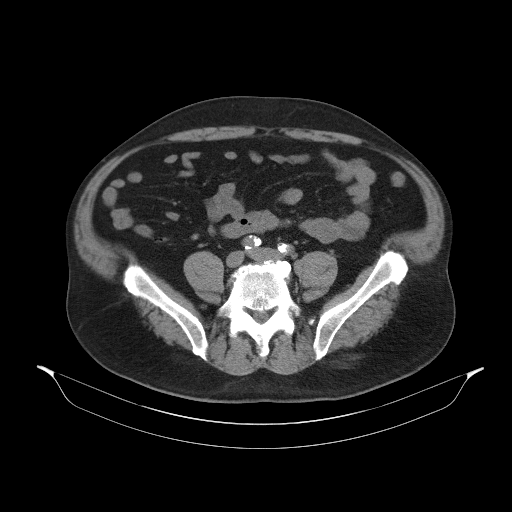
[im 57/107  soft-tissue]
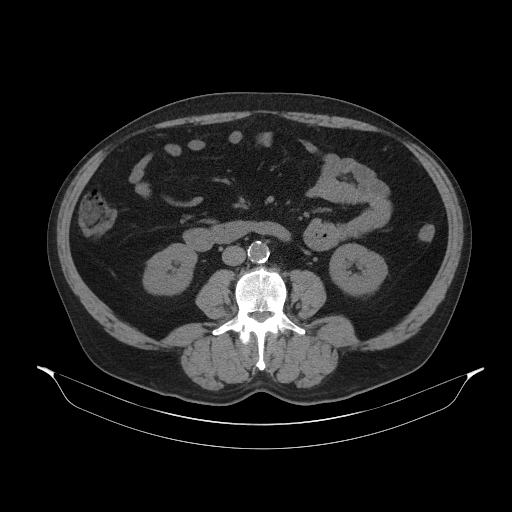
[im 63/107  soft-tissue]
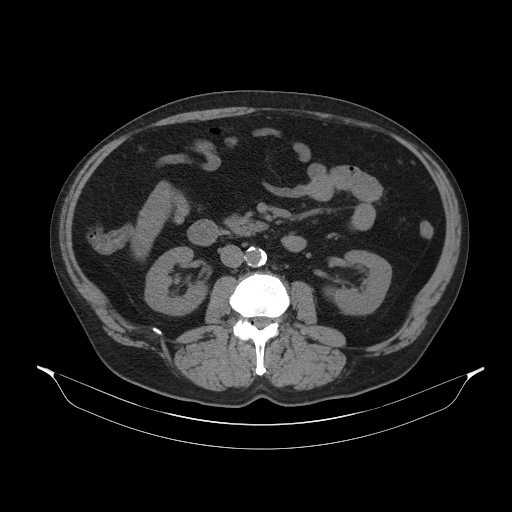
[im 69/107  soft-tissue]
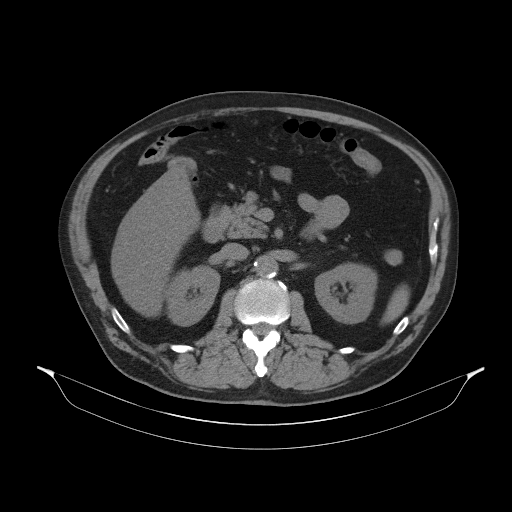
[im 69/107  bone]
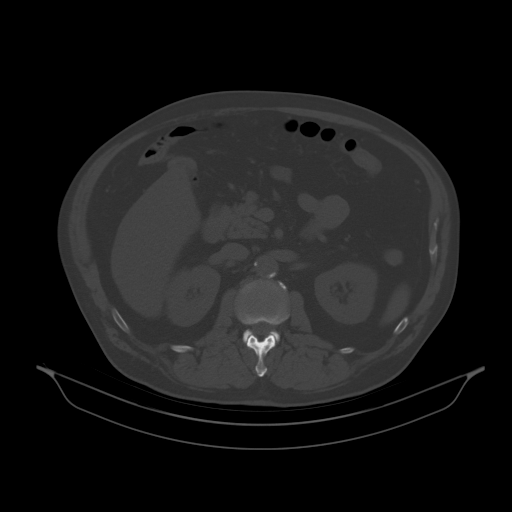
[im 75/107  soft-tissue]
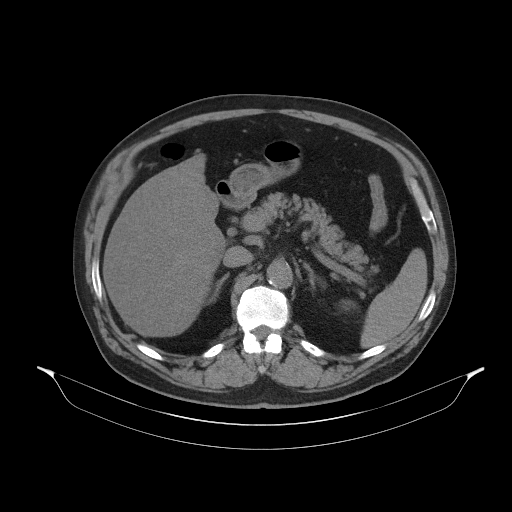
[im 82/107  soft-tissue]
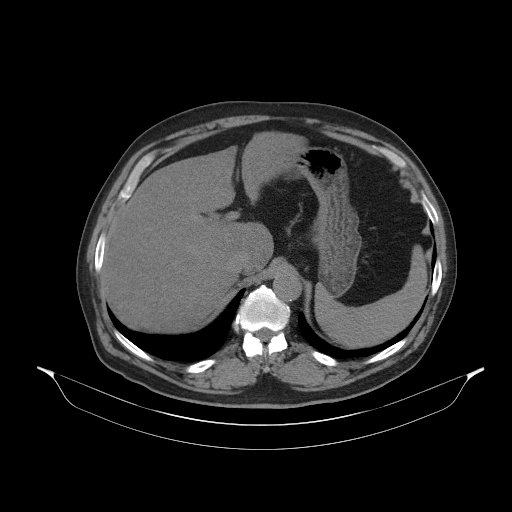
[im 82/107  lung]
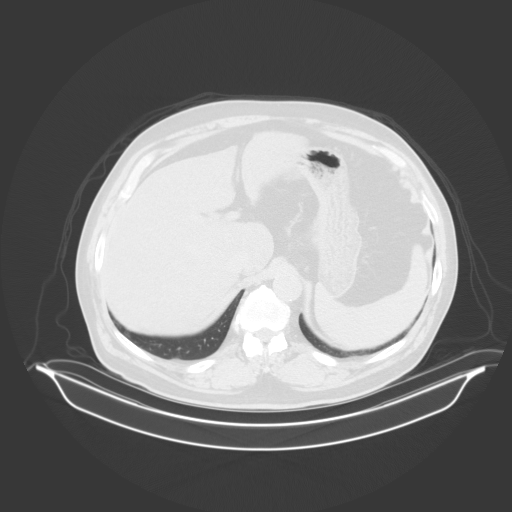
[im 88/107  lung]
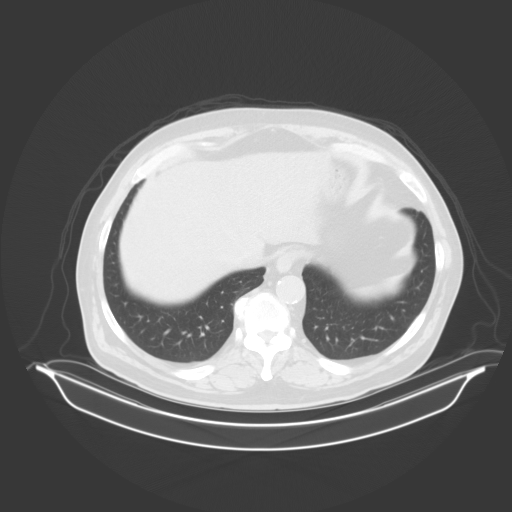
[im 94/107  soft-tissue]
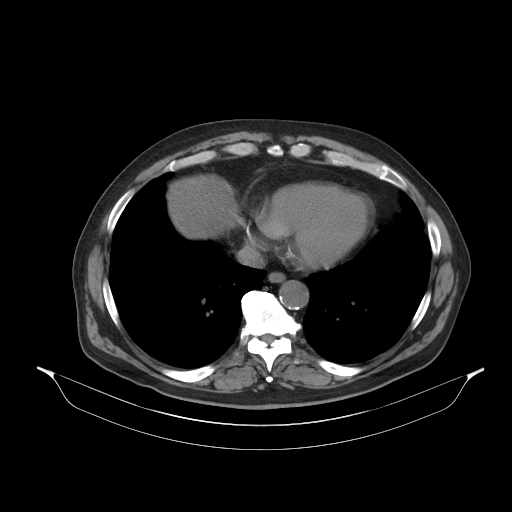
[im 94/107  lung]
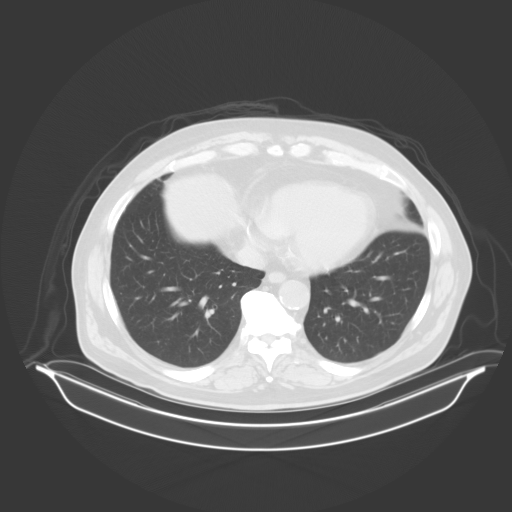
[im 100/107  soft-tissue]
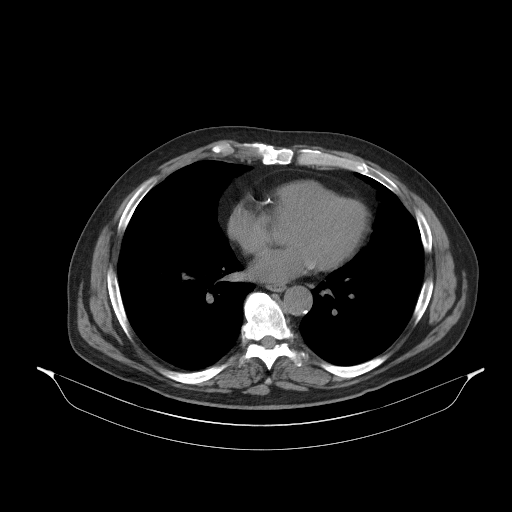
[im 100/107  lung]
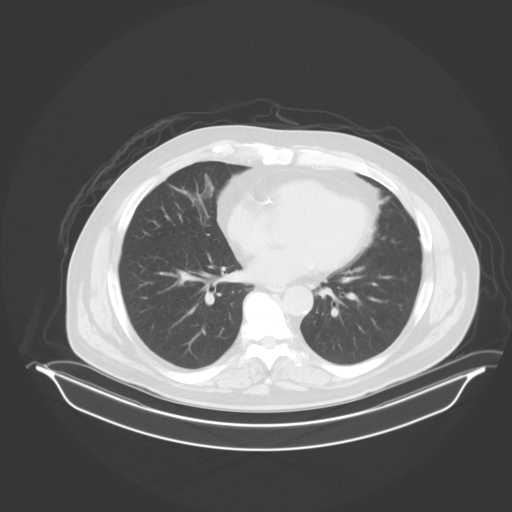

[14 of 32 positions shown; findings below may reference images not displayed]

FINDINGS: Please note that parenchymal abnormalities may be missed without
intravenous contrast.

Lower chest: No acute abnormality.

Hepatobiliary: Hepatic steatosis identified without focal hepatic
lesion. Patient is status post cholecystectomy. No biliary
dilatation.

Pancreas: Unremarkable

Spleen: Unremarkable

Adrenals/Urinary Tract: The kidneys and adrenal glands are
unremarkable. Mild circumferential bladder wall thickening
identified.

Stomach/Bowel: Stomach is within normal limits. Appendix appears
normal. No evidence of bowel wall thickening, distention, or
inflammatory changes.

Vascular/Lymphatic: Aortic atherosclerosis. No enlarged abdominal or
pelvic lymph nodes.

Reproductive: Prostate enlargement noted.

Other: No ascites, pneumoperitoneum or focal collection.

Musculoskeletal: No acute or suspicious bony abnormalities.
IMPRESSION: 1. No evidence of acute abnormality. No findings to suggest a cause
for this patient's abdominal pain.
2. Hepatic steatosis.
3. Prostate enlargement
4.  Aortic Atherosclerosis ([id number]).

## 2018-10-18 ENCOUNTER — Other Ambulatory Visit: Payer: Self-pay | Admitting: Emergency Medicine

## 2018-10-18 MED ORDER — LOSARTAN POTASSIUM-HCTZ 100-12.5 MG PO TABS: 0.5000 | ORAL_TABLET | Freq: Every day | ORAL | 0 refills | Status: AC

## 2018-10-18 NOTE — Telephone Encounter (Signed)
30 day courtesy refill. Patient needs office visit for additional refills

## 2018-10-26 ENCOUNTER — Telehealth: Payer: Self-pay | Admitting: Emergency Medicine

## 2018-10-26 NOTE — Telephone Encounter (Signed)
Patient left voicemail message to request refill of Losartan. Patient states he has a four day supply leftPatient states that this the the 4th message that he has left in two weeks. Patient can be contacted at [phone].

## 2018-10-26 NOTE — Telephone Encounter (Signed)
Spoke with pt about medication request, informed him that a 15 day was sent to pharmacy on 3/30. Also informed pt that he needs to schedule an appointment for further refills. He verbalized understanding and states hi insurance has changed and he has to see a Environmental consultant. I verbalized understanding.

## 2022-07-01 ENCOUNTER — Emergency Department (HOSPITAL_COMMUNITY): Payer: BLUE CROSS/BLUE SHIELD

## 2022-07-01 ENCOUNTER — Emergency Department (HOSPITAL_COMMUNITY)
Admission: EM | Admit: 2022-07-01 | Discharge: 2022-07-01 | Disposition: A | Discharge status: Home / Self Care | Payer: BLUE CROSS/BLUE SHIELD | Attending: Emergency Medicine | Admitting: Emergency Medicine

## 2022-07-01 ENCOUNTER — Encounter (HOSPITAL_COMMUNITY): Payer: Self-pay | Admitting: Radiology

## 2022-07-01 DIAGNOSIS — W01198A Fall on same level from slipping, tripping and stumbling with subsequent striking against other object, initial encounter: Secondary | ICD-10-CM | POA: Insufficient documentation

## 2022-07-01 DIAGNOSIS — R000 Tachycardia, unspecified: Secondary | ICD-10-CM | POA: Diagnosis not present

## 2022-07-01 DIAGNOSIS — S2242XA Multiple fractures of ribs, left side, initial encounter for closed fracture: Secondary | ICD-10-CM | POA: Insufficient documentation

## 2022-07-01 DIAGNOSIS — Z79899 Other long term (current) drug therapy: Secondary | ICD-10-CM | POA: Diagnosis not present

## 2022-07-01 DIAGNOSIS — I10 Essential (primary) hypertension: Secondary | ICD-10-CM | POA: Insufficient documentation

## 2022-07-01 DIAGNOSIS — R7401 Elevation of levels of liver transaminase levels: Secondary | ICD-10-CM | POA: Diagnosis not present

## 2022-07-01 DIAGNOSIS — S299XXA Unspecified injury of thorax, initial encounter: Secondary | ICD-10-CM | POA: Diagnosis present

## 2022-07-01 DIAGNOSIS — S0990XA Unspecified injury of head, initial encounter: Secondary | ICD-10-CM | POA: Diagnosis not present

## 2022-07-01 DIAGNOSIS — S12101A Unspecified nondisplaced fracture of second cervical vertebra, initial encounter for closed fracture: Secondary | ICD-10-CM

## 2022-07-01 DIAGNOSIS — S12191A Other nondisplaced fracture of second cervical vertebra, initial encounter for closed fracture: Secondary | ICD-10-CM | POA: Diagnosis not present

## 2022-07-01 DIAGNOSIS — W19XXXA Unspecified fall, initial encounter: Secondary | ICD-10-CM

## 2022-07-01 LAB — COMPREHENSIVE METABOLIC PANEL
ALT: 53 U/L — ABNORMAL HIGH (ref 0–44)
AST: 68 U/L — ABNORMAL HIGH (ref 15–41)
Albumin: 3.9 g/dL (ref 3.5–5.0)
Alkaline Phosphatase: 77 U/L (ref 38–126)
Anion gap: 8 (ref 5–15)
BUN: 16 mg/dL (ref 8–23)
CO2: 28 mmol/L (ref 22–32)
Calcium: 8.8 mg/dL — ABNORMAL LOW (ref 8.9–10.3)
Chloride: 102 mmol/L (ref 98–111)
Creatinine, Ser: 0.95 mg/dL (ref 0.61–1.24)
GFR, Estimated: >60 mL/min (ref >60)
Glucose, Bld: 117 mg/dL — ABNORMAL HIGH (ref 70–99)
Potassium: 3.9 mmol/L (ref 3.5–5.1)
Sodium: 138 mmol/L (ref 135–145)
Total Bilirubin: 1.4 mg/dL — ABNORMAL HIGH (ref 0.3–1.2)
Total Protein: 7.8 g/dL (ref 6.5–8.1)

## 2022-07-01 LAB — CBC WITH DIFFERENTIAL/PLATELET
Abs Immature Granulocytes: 0.07 10*3/uL (ref 0.00–0.07)
Basophils Absolute: 0 10*3/uL (ref 0.0–0.1)
Basophils Relative: 1 %
Eosinophils Absolute: 0.1 10*3/uL (ref 0.0–0.5)
Eosinophils Relative: 2 %
HCT: 50.6 % (ref 39.0–52.0)
Hemoglobin: 17.1 g/dL — ABNORMAL HIGH (ref 13.0–17.0)
Immature Granulocytes: 1 %
Lymphocytes Relative: 18 %
Lymphs Abs: 0.9 10*3/uL (ref 0.7–4.0)
MCH: 34.6 pg — ABNORMAL HIGH (ref 26.0–34.0)
MCHC: 33.8 g/dL (ref 30.0–36.0)
MCV: 102.4 fL — ABNORMAL HIGH (ref 80.0–100.0)
Monocytes Absolute: 0.4 10*3/uL (ref 0.1–1.0)
Monocytes Relative: 9 %
Neutro Abs: 3.6 10*3/uL (ref 1.7–7.7)
Neutrophils Relative %: 69 %
Platelets: 212 10*3/uL (ref 150–400)
RBC: 4.94 MIL/uL (ref 4.22–5.81)
RDW: 12.4 % (ref 11.5–15.5)
WBC: 5.1 10*3/uL (ref 4.0–10.5)
nRBC: 0 % (ref 0.0–0.2)

## 2022-07-01 LAB — I-STAT CREATININE, ED: Creatinine, Ser: 1 mg/dL (ref 0.61–1.24)

## 2022-07-01 MED ORDER — LACTATED RINGERS IV BOLUS
1000.0000 mL | Freq: Once | INTRAVENOUS | Status: AC
  Administered 2022-07-01: 1000 mL via INTRAVENOUS

## 2022-07-01 MED ORDER — SENNOSIDES-DOCUSATE SODIUM 8.6-50 MG PO TABS: 1.0000 | ORAL_TABLET | Freq: Every evening | ORAL | 0 refills | Status: AC | PRN

## 2022-07-01 MED ORDER — LIDOCAINE 5 % EX PTCH: 1.0000 | MEDICATED_PATCH | CUTANEOUS | 0 refills | Status: AC

## 2022-07-01 MED ORDER — LIDOCAINE 5 % EX PTCH
1.0000 | MEDICATED_PATCH | CUTANEOUS | Status: DC
  Administered 2022-07-01: 1 via TRANSDERMAL
  Filled 2022-07-01: qty 1

## 2022-07-01 MED ORDER — ACETAMINOPHEN 500 MG PO TABS
1000.0000 mg | ORAL_TABLET | Freq: Once | ORAL | Status: DC
  Filled 2022-07-01: qty 2

## 2022-07-01 MED ORDER — SODIUM CHLORIDE (PF) 0.9 % IJ SOLN
INTRAMUSCULAR | Status: AC
  Filled 2022-07-01: qty 50

## 2022-07-01 MED ORDER — OXYCODONE HCL 5 MG PO TABS: 5.0000 mg | ORAL_TABLET | Freq: Four times a day (QID) | ORAL | 0 refills | Status: AC | PRN

## 2022-07-01 MED ORDER — OXYCODONE HCL 5 MG PO TABS
5.0000 mg | ORAL_TABLET | Freq: Once | ORAL | Status: AC
  Administered 2022-07-01: 5 mg via ORAL
  Filled 2022-07-01: qty 1

## 2022-07-01 MED ORDER — LIDOCAINE-EPINEPHRINE (PF) 2 %-1:200000 IJ SOLN
10.0000 mL | Freq: Once | INTRAMUSCULAR | Status: AC
  Administered 2022-07-01: 10 mL
  Filled 2022-07-01: qty 20

## 2022-07-01 MED ORDER — HYDROMORPHONE HCL 1 MG/ML IJ SOLN
1.0000 mg | Freq: Once | INTRAMUSCULAR | Status: AC
  Administered 2022-07-01: 1 mg via INTRAVENOUS
  Filled 2022-07-01: qty 1

## 2022-07-01 MED ORDER — IOHEXOL 300 MG/ML  SOLN
75.0000 mL | Freq: Once | INTRAMUSCULAR | Status: AC | PRN
  Administered 2022-07-01: 75 mL via INTRAVENOUS

## 2022-07-01 NOTE — Discharge Instructions (Addendum)
You were seen in the emergency department after your fall.  Your workup shows that you have 4 rib fractures on your left side as well as a fracture in your neck.  You had no signs of injury to your lungs or any other injuries.  We have given you a neck collar that you can wear as needed for comfort.  For your rib fractures, you can use a lidocaine patch and this can stay on for 12 hours at a time, ice usually feels better the first 3 days after an accident and he feels better thereafter.  You can take Tylenol and Motrin as needed for pain and both can be taken up to every 6 hours.  You can alternate them every 3 hours with ibuprofen at 6:00 and Tylenol at 9:00 etc.  I have given you a prescription for oxycodone to take as needed for breakthrough pain.  This can make you drowsy so do not take it before driving, operating heavy machinery, or working.  Can also make you constipated so I recommend taking a stool softener while you are taking this.  You have 7 stitches in your forehead and you should follow-up with your primary doctor in about 5 days to have your stitches removed and to have your ribs rechecked.  You can follow-up in the neurosurgery clinic in the next couple of weeks to have your neck rechecked.  You should return to the emergency department if you develop shortness of breath, numbness or weakness in your arms, you have fevers or if you have any other new or concerning symptoms.

## 2022-07-01 NOTE — ED Triage Notes (Signed)
Patient here from home reporting fall  after floor collapsed and he feel through. Reports left side of head pain and left rib cage pain. Loss of consciousness for less than 1 min.  No blood thinners.

## 2022-07-01 NOTE — ED Provider Triage Note (Signed)
Emergency Medicine Provider Triage Evaluation Note  Logan Kim , a 61 y.o. male  was evaluated in triage.  Pt complains of left rib cage pain and head laceration that occurred after falling 3 feet through [employer]. Reports shortness of breath, left rib cage pain and headache. -BT. +LOC 1 minute. Witnessed. Tdap <5 years.   Review of Systems  Positive: Head injury, rib cage pain Negative: Abdominal pain  Physical Exam  BP (!) 165/108 (BP Location: Left Arm)   Pulse 90   Temp 97.7 F (36.5 C) (Oral)   Resp 18   SpO2 100%  Gen:   Awake, no distress   Resp:  Normal effort  MSK:   Moves extremities without difficulty  Other:  +deep 3 cm laceration to L forehead. +TTP of left rib cage. Decreased breath sounds on left side.  Medical Decision Making  Medically screening exam initiated at 12:52 PM.  Appropriate orders placed.  Logan Kim was informed that the remainder of the evaluation will be completed by another provider, this initial triage assessment does not replace that evaluation, and the importance of remaining in the ED until their evaluation is complete.     Osvaldo Shipper, [place] 07/01/22 1254

## 2022-07-01 NOTE — ED Provider Notes (Signed)
[facility] DEPT Provider Note   CSN: [id number] Arrival date & time: 07/01/22  1242     History  Chief Complaint  Patient presents with  . Fall  . Head Injury  . Loss of Consciousness    Logan Kim is a 61 y.o. male.  Is a 61 year old male with a past medical history of hypertension and hyperlipidemia presenting to the emergency department after a fall.  Patient states that he was working at a [employer] site today and stepped on a floor plank that was not secured to the ground and fell through the floor.  He states that he hit the left side of his ribs on the floor and the left side of his head.  He states that he had a brief loss of consciousness for several seconds.  He states that he is having a headache and left-sided rib pain.  He denies any shortness of breath.  He denies any nausea, vomiting, numbness or weakness.  He denies any blood thinner use.  The history is provided by the patient.  Fall  Head Injury Loss of Consciousness      Home Medications Prior to Admission medications   Medication Sig Start Date End Date Taking? Authorizing Provider  lidocaine (LIDODERM) 5 % Place 1 patch onto the skin daily. Remove & Discard patch within 12 hours or as directed by MD 07/01/22  Yes Maylon Peppers, Jordan Hawks K, DO  oxyCODONE (ROXICODONE) 5 MG immediate release tablet Take 1 tablet (5 mg total) by mouth every 6 (six) hours as needed for severe pain. 07/01/22  Yes Maylon Peppers, [place] K, DO  senna-docusate (SENOKOT-S) 8.6-50 MG tablet Take 1 tablet by mouth at bedtime as needed for mild constipation. 07/01/22  Yes Maylon Peppers, [place], DO  cyclobenzaprine (FLEXERIL) 10 MG tablet Take 1 tablet (10 mg total) by mouth at bedtime. 01/11/18   Horald Pollen, MD  ibuprofen (ADVIL,MOTRIN) 200 MG tablet Take 200 mg by mouth 2 (two) times daily.    [provider]  losartan-hydrochlorothiazide (HYZAAR) 100-12.5 MG tablet Take 0.5 tablets by mouth  daily. 10/18/18 01/16/19  Horald Pollen, MD  Multiple Vitamins-Minerals (MULTIVITAMIN PO) Take 1 tablet by mouth daily.    [provider]  Na Sulfate-K Sulfate-Mg Sulf SOLN Take 1 kit by mouth once. 04/26/15   Irene Shipper, MD  naproxen sodium (ALEVE) 220 MG tablet Take 220 mg by mouth as needed.    [provider]  OVER THE COUNTER MEDICATION Apply 1 drop to eye every morning. Redness/allergy eye drop    [provider]  OVER THE COUNTER MEDICATION daily.    [provider]      Allergies    Penicillins    Review of Systems   Review of Systems  Cardiovascular:  Positive for syncope.    Physical Exam Updated Vital Signs BP (!) 175/119 (BP Location: Right Arm)   Pulse (!) 120   Temp 97.7 F (36.5 C) (Oral)   Resp 16   SpO2 99%  Physical Exam Vitals and nursing note reviewed.  Constitutional:      General: He is not in acute distress.    Appearance: Normal appearance. He is obese.  HENT:     Head: Normocephalic.     Comments: ~5 cm gaping laceration to L forehead, no active bleeding    Nose: Nose normal.     Mouth/Throat:     Mouth: Mucous membranes are moist.     Pharynx: Oropharynx is  clear.  Eyes:     Extraocular Movements: Extraocular movements intact.     Conjunctiva/sclera: Conjunctivae normal.     Pupils: Pupils are equal, round, and reactive to light.  Neck:     Comments: No midline neck tenderness Cardiovascular:     Rate and Rhythm: Regular rhythm. Tachycardia present.     Pulses: Normal pulses.     Heart sounds: Normal heart sounds.  Pulmonary:     Effort: Pulmonary effort is normal.     Breath sounds: Normal breath sounds.     Comments: L inferior lateral chest wall tenderness to palpation Abdominal:     General: Abdomen is flat.     Palpations: Abdomen is soft.     Tenderness: There is no abdominal tenderness.  Musculoskeletal:        General: No swelling or deformity. Normal range of motion.     Cervical  back: Normal range of motion and neck supple.  Skin:    General: Skin is warm and dry.  Neurological:     General: No focal deficit present.     Mental Status: He is alert and oriented to person, place, and time.     Cranial Nerves: No cranial nerve deficit.     Sensory: No sensory deficit.     Motor: No weakness.  Psychiatric:        Mood and Affect: Mood normal.        Behavior: Behavior normal.     ED Results / Procedures / Treatments   Labs (all labs ordered are listed, but only abnormal results are displayed) Labs Reviewed  COMPREHENSIVE METABOLIC PANEL - Abnormal; Notable for the following components:      Result Value   Glucose, Bld 117 (*)    Calcium 8.8 (*)    AST 68 (*)    ALT 53 (*)    Total Bilirubin 1.4 (*)    All other components within normal limits  CBC WITH DIFFERENTIAL/PLATELET - Abnormal; Notable for the following components:   Hemoglobin 17.1 (*)    MCV 102.4 (*)    MCH 34.6 (*)    All other components within normal limits  I-STAT CREATININE, ED    EKG None  Radiology CT Chest W Contrast  Result Date: 07/01/2022 CLINICAL DATA:  Status post fall. Poly trauma. Shortness of breath with left chest pain. EXAM: CT CHEST, ABDOMEN, AND PELVIS WITH CONTRAST TECHNIQUE: Multidetector CT imaging of the chest, abdomen and pelvis was performed following the standard protocol during bolus administration of intravenous contrast. RADIATION DOSE REDUCTION: This exam was performed according to the departmental dose-optimization program which includes automated exposure control, adjustment of the mA and/or kV according to patient size and/or use of iterative reconstruction technique. CONTRAST:  61m OMNIPAQUE IOHEXOL 300 MG/ML  SOLN COMPARISON:  Chest and rib radiographs same date. Abdominopelvic CT 01/20/2018. FINDINGS: CT CHEST FINDINGS Cardiovascular: No evidence of acute aortic injury or mediastinal hematoma. Vascular assessment is mildly limited by breathing artifact.  There is diffuse atherosclerosis of the aorta, great vessels and coronary arteries. The heart size is normal. There is no pericardial effusion. Mediastinum/Nodes: There are no enlarged mediastinal, hilar or axillary lymph nodes. The thyroid gland, trachea and esophagus demonstrate no significant findings. Lungs/Pleura: No pleural effusion or pneumothorax. Mild atelectasis at both lung bases and scattered mild central airway thickening. No evidence of pulmonary contusion or suspicious nodularity. Musculoskeletal/Chest wall: As seen on earlier radiographs, there are multiple acute left-sided rib fractures. There are mildly displaced fractures  of the left 5th through 8th ribs laterally, and a nondisplaced fracture of the left 4th rib. There is a small amount of extrapleural hematoma associated with the 5th and 6 rib fractures. No evidence of right rib, sternal or spinal fracture. CT ABDOMEN AND PELVIS FINDINGS Hepatobiliary: No evidence of acute hepatic injury or suspicious focal lesion. There is diffuse low density throughout the liver consistent with steatosis. No significant biliary dilatation status post cholecystectomy. Pancreas: Unremarkable. No pancreatic ductal dilatation or surrounding inflammatory changes. Spleen: No evidence of acute splenic injury or focal abnormality. Adrenals/Urinary Tract: Both adrenal glands appear normal. No evidence of urinary tract calculus, suspicious renal lesion or hydronephrosis. The bladder appears normal for its degree of distention. Stomach/Bowel: No enteric contrast administered. The stomach appears unremarkable for its degree of distension. No evidence of bowel wall thickening, distention or surrounding inflammatory change. The appendix appears normal. No evidence of bowel or mesenteric injury. Vascular/Lymphatic: There are no enlarged abdominal or pelvic lymph nodes. Scattered small retroperitoneal and mesenteric lymph nodes are unchanged. Diffuse aortic and branch vessel  atherosclerosis without evidence of aneurysm or acute vascular injury. Reproductive: The prostate gland has been resected. Probable small lymphocele or seroma along the left iliac vessels measuring 2.7 x 1.7 cm on image 287/3. Other: No ascites, free air or focal extraluminal fluid collection. Stable small umbilical hernia containing only fat. Musculoskeletal: No acute or significant osseous findings. Mild spondylosis. IMPRESSION: 1. Multiple acute left-sided rib fractures with small amount of associated extrapleural hematoma. No pneumothorax or pleural effusion. 2. No other acute posttraumatic findings identified in the chest, abdomen or pelvis. 3. Hepatic steatosis. 4. Previous prostatectomy with probable small lymphocele or seroma along the left iliac vessels. 5.  Aortic Atherosclerosis (ICD10-I70.0). Electronically Signed   By: Richardean Sale M.D.   On: 07/01/2022 15:33   CT ABDOMEN PELVIS W CONTRAST  Result Date: 07/01/2022 CLINICAL DATA:  Status post fall. Poly trauma. Shortness of breath with left chest pain. EXAM: CT CHEST, ABDOMEN, AND PELVIS WITH CONTRAST TECHNIQUE: Multidetector CT imaging of the chest, abdomen and pelvis was performed following the standard protocol during bolus administration of intravenous contrast. RADIATION DOSE REDUCTION: This exam was performed according to the departmental dose-optimization program which includes automated exposure control, adjustment of the mA and/or kV according to patient size and/or use of iterative reconstruction technique. CONTRAST:  60m OMNIPAQUE IOHEXOL 300 MG/ML  SOLN COMPARISON:  Chest and rib radiographs same date. Abdominopelvic CT 01/20/2018. FINDINGS: CT CHEST FINDINGS Cardiovascular: No evidence of acute aortic injury or mediastinal hematoma. Vascular assessment is mildly limited by breathing artifact. There is diffuse atherosclerosis of the aorta, great vessels and coronary arteries. The heart size is normal. There is no pericardial  effusion. Mediastinum/Nodes: There are no enlarged mediastinal, hilar or axillary lymph nodes. The thyroid gland, trachea and esophagus demonstrate no significant findings. Lungs/Pleura: No pleural effusion or pneumothorax. Mild atelectasis at both lung bases and scattered mild central airway thickening. No evidence of pulmonary contusion or suspicious nodularity. Musculoskeletal/Chest wall: As seen on earlier radiographs, there are multiple acute left-sided rib fractures. There are mildly displaced fractures of the left 5th through 8th ribs laterally, and a nondisplaced fracture of the left 4th rib. There is a small amount of extrapleural hematoma associated with the 5th and 6 rib fractures. No evidence of right rib, sternal or spinal fracture. CT ABDOMEN AND PELVIS FINDINGS Hepatobiliary: No evidence of acute hepatic injury or suspicious focal lesion. There is diffuse low density throughout the liver consistent  with steatosis. No significant biliary dilatation status post cholecystectomy. Pancreas: Unremarkable. No pancreatic ductal dilatation or surrounding inflammatory changes. Spleen: No evidence of acute splenic injury or focal abnormality. Adrenals/Urinary Tract: Both adrenal glands appear normal. No evidence of urinary tract calculus, suspicious renal lesion or hydronephrosis. The bladder appears normal for its degree of distention. Stomach/Bowel: No enteric contrast administered. The stomach appears unremarkable for its degree of distension. No evidence of bowel wall thickening, distention or surrounding inflammatory change. The appendix appears normal. No evidence of bowel or mesenteric injury. Vascular/Lymphatic: There are no enlarged abdominal or pelvic lymph nodes. Scattered small retroperitoneal and mesenteric lymph nodes are unchanged. Diffuse aortic and branch vessel atherosclerosis without evidence of aneurysm or acute vascular injury. Reproductive: The prostate gland has been resected. Probable  small lymphocele or seroma along the left iliac vessels measuring 2.7 x 1.7 cm on image 287/3. Other: No ascites, free air or focal extraluminal fluid collection. Stable small umbilical hernia containing only fat. Musculoskeletal: No acute or significant osseous findings. Mild spondylosis. IMPRESSION: 1. Multiple acute left-sided rib fractures with small amount of associated extrapleural hematoma. No pneumothorax or pleural effusion. 2. No other acute posttraumatic findings identified in the chest, abdomen or pelvis. 3. Hepatic steatosis. 4. Previous prostatectomy with probable small lymphocele or seroma along the left iliac vessels. 5.  Aortic Atherosclerosis (ICD10-I70.0). Electronically Signed   By: Richardean Sale M.D.   On: 07/01/2022 15:33   CT Cervical Spine Wo Contrast  Result Date: 07/01/2022 CLINICAL DATA:  [place], loss of consciousness EXAM: CT CERVICAL SPINE WITHOUT CONTRAST TECHNIQUE: Multidetector CT imaging of the cervical spine was performed without intravenous contrast. Multiplanar CT image reconstructions were also generated. RADIATION DOSE REDUCTION: This exam was performed according to the departmental dose-optimization program which includes automated exposure control, adjustment of the mA and/or kV according to patient size and/or use of iterative reconstruction technique. COMPARISON:  05/09/2021 FINDINGS: Alignment: Alignment is anatomic. Skull base and vertebrae: There is a small oblique lucency through the right lateral aspect of the C2 vertebral body, extending into the articulation with the right lateral mass of C1, consistent with acute fracture. No significant displacement, and alignment is anatomic. No other acute displaced cervical spine fractures. There are no destructive bony lesions. Soft tissues and spinal canal: No prevertebral fluid or swelling. No visible canal hematoma. Disc levels: Multilevel spondylosis and facet hypertrophy, greatest at C5-6 and C6-7. There is right  neural foraminal encroachment C5-6, with symmetrical neural foraminal encroachment and mild central canal stenosis at C6-7. Upper chest: Airway is patent.  Lung apices are clear. Other: Reconstructed images demonstrate no additional findings. IMPRESSION: 1. Small essentially nondisplaced fracture within the right lateral aspect of the C2 vertebral body, extending into the posterior margin of the articulation with the right lateral mass of C1. Alignment is anatomic. 2. No other acute cervical spine fracture. 3. Multilevel cervical spondylosis as above. Critical Value/emergent results were called by telephone at the time of interpretation on 07/01/2022 at 3:22 pm to provider [facility] , who verbally acknowledged these results. Electronically Signed   By: Randa Ngo M.D.   On: 07/01/2022 15:22   CT Head Wo Contrast  Result Date: 07/01/2022 CLINICAL DATA:  Poly trauma blood.  Fall. EXAM: CT HEAD WITHOUT CONTRAST TECHNIQUE: Contiguous axial images were obtained from the base of the skull through the vertex without intravenous contrast. RADIATION DOSE REDUCTION: This exam was performed according to the departmental dose-optimization program which includes automated exposure control, adjustment of the mA  and/or kV according to patient size and/or use of iterative reconstruction technique. COMPARISON:  None Available. FINDINGS: Brain: No evidence of acute infarction, hemorrhage, hydrocephalus, extra-axial collection or mass lesion/mass effect. Vascular: Negative for hyperdense vessel Skull: Negative Sinuses/Orbits: Mild mucosal edema paranasal sinuses. Negative orbit Other: Laceration left frontal scalp anteriorly and laterally IMPRESSION: 1. No acute intracranial abnormality. 2. Laceration left frontal scalp. Electronically Signed   By: Franchot Gallo M.D.   On: 07/01/2022 15:22   DG Ribs Unilateral W/Chest Left  Result Date: 07/01/2022 CLINICAL DATA:  Fall.  Hypoxia. EXAM: LEFT RIBS AND CHEST - 3+  VIEW COMPARISON:  Chest two views 04/04/2014 FINDINGS: Cardiac silhouette and mediastinal contours are within normal limits. Mild calcification within aortic arch. Mildly decreased lung volumes. Left costophrenic angle horizontal linear likely subsegmental atelectasis. No definite pleural effusion. No pneumothorax. Mild multilevel degenerative disc changes of the thoracic spine. There are mildly displaced acute fractures of the left fifth through eighth ribs. IMPRESSION: Mildly displaced acute fractures of the left fifth through eighth ribs. No pneumothorax. Electronically Signed   By: Yvonne Kendall M.D.   On: 07/01/2022 13:42   DG Chest 2 View  Result Date: 07/01/2022 CLINICAL DATA:  Provided history: Hypoxia. Additional history provided: Fall, pain in left rib cage, loss of consciousness. EXAM: CHEST - 2 VIEW COMPARISON:  Radiographs of the chest and right ribs 12/22/2019. Same day radiographs of the left ribs 07/01/2022. FINDINGS: Heart size within normal limits. Opacity within the lateral left lung base. No appreciable airspace consolidation on the right. No definite pleural fluid. No evidence of pneumothorax. Acute, displaced fractures of the lateral left fifth, sixth, seventh and eighth ribs better appreciated on same day rib radiographs. IMPRESSION: Acute, displaced fractures of the lateral left fifth, sixth, seventh and eighth ribs (better appreciated on same day rib radiographs). Opacity within the lateral left lung base. This is suspicious for pulmonary contusion given the adjacent acute rib fractures. However, pneumonia cannot be excluded. Electronically Signed   By: Kellie Simmering D.O.   On: 07/01/2022 13:39    Procedures .Marland KitchenLaceration Repair  Date/Time: 07/01/2022 4:22 PM  Performed by: Kemper Durie, DO Authorized by: Kemper Durie, DO   Consent:    Consent obtained:  Verbal   Consent given by:  Patient   Risks, benefits, and alternatives were discussed: yes     Risks  discussed:  Infection, pain, poor cosmetic result, need for additional repair, nerve damage, poor wound healing and vascular damage   Alternatives discussed:  No treatment and delayed treatment Universal protocol:    Procedure explained and questions answered to patient or proxy's satisfaction: yes     Imaging studies available: yes     Patient identity confirmed:  Verbally with patient Laceration details:    Location:  Face   Face location:  Forehead   Length (cm):  6   Depth (mm):  5 Pre-procedure details:    Preparation:  Imaging obtained to evaluate for foreign bodies Exploration:    Limited defect created (wound extended): yes     Hemostasis achieved with:  Direct pressure   Imaging obtained comment:  CTH   Imaging outcome: foreign body not noted     Wound exploration: wound explored through full range of motion and entire depth of wound visualized     Wound extent: areolar tissue not violated, fascia not violated, no foreign body, no signs of injury, no nerve damage, no tendon damage, no underlying fracture and no vascular damage  Contaminated: no   Treatment:    Area cleansed with:  Saline   Amount of cleaning:  Standard   Visualized foreign bodies/material removed: no     Debridement:  None   Undermining:  None   Scar revision: no   Skin repair:    Repair method:  Sutures   Suture size:  4-0   Suture material:  Prolene   Suture technique:  Simple interrupted   Number of sutures:  7 Approximation:    Approximation:  Close Repair type:    Repair type:  Simple Post-procedure details:    Dressing:  Adhesive bandage     Medications Ordered in ED Medications  lidocaine (LIDODERM) 5 % 1 patch (1 patch Transdermal Patch Applied 07/01/22 1420)  acetaminophen (TYLENOL) tablet 1,000 mg (1,000 mg Oral Patient Refused/Not Given 07/01/22 1558)  sodium chloride (PF) 0.9 % injection (has no administration in time range)  HYDROmorphone (DILAUDID) injection 1 mg (1 mg  Intravenous Given 07/01/22 1330)  lidocaine-EPINEPHrine (XYLOCAINE W/EPI) 2 %-1:200000 (PF) injection 10 mL (10 mLs Infiltration Given by Other 07/01/22 1559)  lactated ringers bolus 1,000 mL (1,000 mLs Intravenous New Bag/Given 07/01/22 1600)  oxyCODONE (Oxy IR/ROXICODONE) immediate release tablet 5 mg (5 mg Oral Given 07/01/22 1558)  iohexol (OMNIPAQUE) 300 MG/ML solution 75 mL (75 mLs Intravenous Contrast Given 07/01/22 1449)    ED Course/ Medical Decision Making/ A&P Clinical Course as of 07/01/22 1635  Tue Jul 01, 2022  1606 CT with evidence of R lateral C2 verterbral body fracture non-displaced. I spoke with Dr. Reatha Armour of neurosurgery who recommended Aspen collar for comfort and follow up outpatient in a few weeks. Please see my procedure note for laceration repair. Remainder of imaging showed 4 L-sided rib fractures. Patient was offered admission for pain control but would prefer outpatient management. He was given incentive spirometer and recommended close primary care follow up. He was given strict return precautions.  [VK]  0254 HR is improving to 110s with improved pain control.  [VK]    Clinical Course User Index [VK] Kemper Durie, DO                           Medical Decision Making This patient presents to the ED with chief complaint(s) of fall with pertinent past medical history of HTN, HLD which further complicates the presenting complaint. The complaint involves an extensive differential diagnosis and also carries with it a high risk of complications and morbidity.    The differential diagnosis includes laceration, ICH, mass effect, skull fracture, spinal fracture, rib fractures, pulmonary contusion, muscle sprain  Additional history obtained: Additional history obtained from co-worker  ED Course and Reassessment: Patient was initially evaluated by provider in triage and had chest and rib series x-rays performed that showed evidence of multiple rib fractures on the  left with possible pulmonary contusion.  Patient does have a large gaping laceration to his forehead that will require laceration repair.  CT of the head and neck will additionally be performed to evaluate for fracture, ICH or mass effect as well as a CT thorax to evaluate for any lung injury.  Patient was given pain control.  He is up-to-date on his tetanus.  Independent labs interpretation:  The following labs were independently interpreted: Mildly elevated LFTs otherwise within normal range  Independent visualization of imaging: - I independently visualized the following imaging with scope of interpretation limited to determining acute life threatening conditions related to emergency  care: CT head/C-spine, CT chest abdomen pelvis, which revealed nondisplaced C2 fracture, multiple left-sided rib fractures, no other acute traumatic injuries  Consultation: - Consulted or discussed management/test interpretation w/ external professional: Neurosurgery  Consideration for admission or further workup: Due to patient's multiple rib fractures, I offered him admission for pain control, however he states that he would prefer outpatient follow-up he states here decision making, plans for discharge.  He was given strict return precautions and recommended multimodal pain control. Social Determinants of health: N/A    Amount and/or Complexity of Data Reviewed Radiology: ordered.  Risk OTC drugs. Prescription drug management.          Final Clinical Impression(s) / ED Diagnoses Final diagnoses:  Fall, initial encounter  Closed nondisplaced fracture of second cervical vertebra, unspecified fracture morphology, initial encounter ([place])  Closed fracture of multiple ribs of left side, initial encounter    Rx / DC Orders ED Discharge Orders          Ordered    oxyCODONE (ROXICODONE) 5 MG immediate release tablet  Every 6 hours PRN        07/01/22 1628    lidocaine (LIDODERM) 5 %  Every 24  hours        07/01/22 1628    senna-docusate (SENOKOT-S) 8.6-50 MG tablet  At bedtime PRN        07/01/22 1628              Kemper Durie, DO 07/01/22 1635
